# Patient Record
Sex: Female | Born: 1980 | Race: Black or African American | Hispanic: No | Smoking: Current every day smoker
Health system: Southern US, Community
[De-identification: ages and names within clinical notes are randomized; demographics above are authoritative.]

## PROBLEM LIST (undated history)

## (undated) ENCOUNTER — Inpatient Hospital Stay (HOSPITAL_COMMUNITY): Payer: Self-pay

## (undated) DIAGNOSIS — N10 Acute tubulo-interstitial nephritis: Secondary | ICD-10-CM

---

## 2003-04-29 ENCOUNTER — Encounter: Payer: Self-pay | Admitting: Obstetrics & Gynecology

## 2003-04-29 ENCOUNTER — Inpatient Hospital Stay (HOSPITAL_COMMUNITY): Admission: AD | Admit: 2003-04-29 | Discharge: 2003-04-29 | Payer: Self-pay | Admitting: Obstetrics & Gynecology

## 2003-05-23 ENCOUNTER — Inpatient Hospital Stay (HOSPITAL_COMMUNITY): Admission: AD | Admit: 2003-05-23 | Discharge: 2003-05-23 | Payer: Self-pay | Admitting: Family Medicine

## 2003-07-13 ENCOUNTER — Ambulatory Visit (HOSPITAL_COMMUNITY): Admission: RE | Admit: 2003-07-13 | Discharge: 2003-07-13 | Payer: Self-pay | Admitting: *Deleted

## 2003-09-29 ENCOUNTER — Inpatient Hospital Stay (HOSPITAL_COMMUNITY): Admission: AD | Admit: 2003-09-29 | Discharge: 2003-09-29 | Payer: Self-pay | Admitting: Obstetrics and Gynecology

## 2003-11-06 ENCOUNTER — Inpatient Hospital Stay (HOSPITAL_COMMUNITY): Admission: AD | Admit: 2003-11-06 | Discharge: 2003-11-06 | Payer: Self-pay | Admitting: Obstetrics and Gynecology

## 2003-12-13 ENCOUNTER — Inpatient Hospital Stay (HOSPITAL_COMMUNITY): Admission: AD | Admit: 2003-12-13 | Discharge: 2003-12-13 | Payer: Self-pay | Admitting: Family Medicine

## 2003-12-17 ENCOUNTER — Encounter: Admission: RE | Admit: 2003-12-17 | Discharge: 2003-12-17 | Payer: Self-pay | Admitting: *Deleted

## 2003-12-18 ENCOUNTER — Inpatient Hospital Stay (HOSPITAL_COMMUNITY): Admission: AD | Admit: 2003-12-18 | Discharge: 2003-12-21 | Payer: Self-pay | Admitting: Family Medicine

## 2004-08-11 ENCOUNTER — Inpatient Hospital Stay (HOSPITAL_COMMUNITY): Admission: AD | Admit: 2004-08-11 | Discharge: 2004-08-11 | Payer: Self-pay | Admitting: Obstetrics & Gynecology

## 2004-12-09 ENCOUNTER — Emergency Department (HOSPITAL_COMMUNITY): Admission: EM | Admit: 2004-12-09 | Discharge: 2004-12-09 | Payer: Self-pay | Admitting: Emergency Medicine

## 2005-03-01 ENCOUNTER — Inpatient Hospital Stay (HOSPITAL_COMMUNITY): Admission: AD | Admit: 2005-03-01 | Discharge: 2005-03-01 | Payer: Self-pay | Admitting: Obstetrics & Gynecology

## 2005-06-09 ENCOUNTER — Inpatient Hospital Stay (HOSPITAL_COMMUNITY): Admission: AD | Admit: 2005-06-09 | Discharge: 2005-06-09 | Payer: Self-pay | Admitting: *Deleted

## 2005-06-13 ENCOUNTER — Inpatient Hospital Stay (HOSPITAL_COMMUNITY): Admission: AD | Admit: 2005-06-13 | Discharge: 2005-06-13 | Payer: Self-pay | Admitting: Family Medicine

## 2005-07-20 ENCOUNTER — Emergency Department (HOSPITAL_COMMUNITY): Admission: EM | Admit: 2005-07-20 | Discharge: 2005-07-21 | Payer: Self-pay | Admitting: Emergency Medicine

## 2006-09-27 ENCOUNTER — Emergency Department (HOSPITAL_COMMUNITY): Admission: EM | Admit: 2006-09-27 | Discharge: 2006-09-27 | Payer: Self-pay | Admitting: Emergency Medicine

## 2007-06-02 ENCOUNTER — Inpatient Hospital Stay (HOSPITAL_COMMUNITY): Admission: AD | Admit: 2007-06-02 | Discharge: 2007-06-02 | Payer: Self-pay | Admitting: Family Medicine

## 2007-06-13 ENCOUNTER — Inpatient Hospital Stay (HOSPITAL_COMMUNITY): Admission: AD | Admit: 2007-06-13 | Discharge: 2007-06-13 | Payer: Self-pay | Admitting: Obstetrics and Gynecology

## 2007-08-28 ENCOUNTER — Inpatient Hospital Stay (HOSPITAL_COMMUNITY): Admission: RE | Admit: 2007-08-28 | Discharge: 2007-08-28 | Payer: Self-pay | Admitting: Obstetrics & Gynecology

## 2008-01-18 ENCOUNTER — Inpatient Hospital Stay (HOSPITAL_COMMUNITY): Admission: AD | Admit: 2008-01-18 | Discharge: 2008-01-21 | Payer: Self-pay | Admitting: Obstetrics

## 2008-01-19 ENCOUNTER — Encounter: Payer: Self-pay | Admitting: Obstetrics

## 2010-11-05 ENCOUNTER — Emergency Department (HOSPITAL_COMMUNITY)
Admission: EM | Admit: 2010-11-05 | Discharge: 2010-11-05 | Disposition: A | Discharge status: Home / Self Care | Payer: Self-pay | Attending: Emergency Medicine | Admitting: Emergency Medicine

## 2010-11-05 DIAGNOSIS — K143 Hypertrophy of tongue papillae: Secondary | ICD-10-CM | POA: Insufficient documentation

## 2011-01-11 ENCOUNTER — Emergency Department (HOSPITAL_COMMUNITY)
Admission: EM | Admit: 2011-01-11 | Discharge: 2011-01-12 | Disposition: A | Discharge status: Home / Self Care | Payer: Self-pay | Attending: Emergency Medicine | Admitting: Emergency Medicine

## 2011-01-11 DIAGNOSIS — R112 Nausea with vomiting, unspecified: Secondary | ICD-10-CM | POA: Insufficient documentation

## 2011-01-11 DIAGNOSIS — R300 Dysuria: Secondary | ICD-10-CM | POA: Insufficient documentation

## 2011-01-11 DIAGNOSIS — R109 Unspecified abdominal pain: Secondary | ICD-10-CM | POA: Insufficient documentation

## 2011-01-11 DIAGNOSIS — N12 Tubulo-interstitial nephritis, not specified as acute or chronic: Secondary | ICD-10-CM | POA: Insufficient documentation

## 2011-01-11 LAB — URINALYSIS, ROUTINE W REFLEX MICROSCOPIC
Bilirubin Urine: NEGATIVE
Glucose, UA: NEGATIVE mg/dL
Ketones, ur: NEGATIVE mg/dL
Nitrite: NEGATIVE
Protein, ur: 30 mg/dL — AB
Specific Gravity, Urine: 1.012 (ref 1.005–1.030)
Urobilinogen, UA: 1 mg/dL (ref 0.0–1.0)
pH: 6.5 (ref 5.0–8.0)

## 2011-01-11 LAB — POCT I-STAT, CHEM 8
BUN: 6 mg/dL (ref 6–23)
Calcium, Ion: 1.17 mmol/L (ref 1.12–1.32)
Chloride: 105 mEq/L (ref 96–112)
Creatinine, Ser: 0.9 mg/dL (ref 0.4–1.2)
Glucose, Bld: 92 mg/dL (ref 70–99)
HCT: 40 % (ref 36.0–46.0)
Hemoglobin: 13.6 g/dL (ref 12.0–15.0)
Potassium: 3.9 mEq/L (ref 3.5–5.1)
Sodium: 140 mEq/L (ref 135–145)
TCO2: 25 mmol/L (ref 0–100)

## 2011-01-11 LAB — URINE MICROSCOPIC-ADD ON

## 2011-01-11 LAB — POCT PREGNANCY, URINE: Preg Test, Ur: NEGATIVE

## 2011-01-13 LAB — URINE CULTURE
Colony Count: >=100000
Culture  Setup Time: 201205310036

## 2011-05-03 LAB — CBC
HCT: 33.9 — ABNORMAL LOW
Hemoglobin: 11.8 — ABNORMAL LOW
MCHC: 34.9
MCV: 93.8
Platelets: 280
RBC: 3.61 — ABNORMAL LOW
RDW: 13.8
WBC: 14.5 — ABNORMAL HIGH

## 2011-05-03 LAB — WET PREP, GENITAL
Clue Cells Wet Prep HPF POC: NONE SEEN
Trich, Wet Prep: NONE SEEN
Yeast Wet Prep HPF POC: NONE SEEN

## 2011-05-03 LAB — ABO/RH: ABO/RH(D): O POS

## 2011-05-03 LAB — GC/CHLAMYDIA PROBE AMP, GENITAL
Chlamydia, DNA Probe: NEGATIVE
GC Probe Amp, Genital: NEGATIVE

## 2011-05-03 LAB — SICKLE CELL SCREEN: Sickle Cell Screen: NEGATIVE

## 2011-05-10 LAB — CBC
HCT: 31 — ABNORMAL LOW
HCT: 37.5
Hemoglobin: 10.9 — ABNORMAL LOW
Hemoglobin: 13.1
MCHC: 35
MCHC: 35.3
MCV: 92.4
MCV: 92.7
Platelets: 186
Platelets: 238
RBC: 3.35 — ABNORMAL LOW
RBC: 4.04
RDW: 13.9
RDW: 14.1
WBC: 11.5 — ABNORMAL HIGH
WBC: 11.9 — ABNORMAL HIGH

## 2011-05-10 LAB — RPR: RPR Ser Ql: NONREACTIVE

## 2011-05-23 LAB — URINALYSIS, ROUTINE W REFLEX MICROSCOPIC
Bilirubin Urine: NEGATIVE
Glucose, UA: NEGATIVE
Hgb urine dipstick: NEGATIVE
Ketones, ur: NEGATIVE
Nitrite: NEGATIVE
Protein, ur: NEGATIVE
Specific Gravity, Urine: 1.02
Urobilinogen, UA: 1
pH: 6.5

## 2011-05-23 LAB — WET PREP, GENITAL
Trich, Wet Prep: NONE SEEN
Yeast Wet Prep HPF POC: NONE SEEN

## 2011-05-23 LAB — CBC
Hemoglobin: 13.1
RBC: 4.07
RDW: 12.7

## 2011-05-23 LAB — GC/CHLAMYDIA PROBE AMP, GENITAL: Chlamydia, DNA Probe: NEGATIVE

## 2011-05-23 LAB — HCG, QUANTITATIVE, PREGNANCY: hCG, Beta Chain, Quant, S: 52440 — ABNORMAL HIGH

## 2011-06-05 ENCOUNTER — Emergency Department (HOSPITAL_COMMUNITY)
Admission: EM | Admit: 2011-06-05 | Discharge: 2011-06-05 | Disposition: A | Discharge status: Home / Self Care | Payer: Self-pay | Attending: Emergency Medicine | Admitting: Emergency Medicine

## 2011-06-05 DIAGNOSIS — R109 Unspecified abdominal pain: Secondary | ICD-10-CM | POA: Insufficient documentation

## 2011-06-05 LAB — URINE MICROSCOPIC-ADD ON

## 2011-06-05 LAB — POCT PREGNANCY, URINE: Preg Test, Ur: NEGATIVE

## 2011-06-05 LAB — URINALYSIS, ROUTINE W REFLEX MICROSCOPIC
Bilirubin Urine: NEGATIVE
Hgb urine dipstick: NEGATIVE
Ketones, ur: NEGATIVE mg/dL
Nitrite: NEGATIVE
pH: 6 (ref 5.0–8.0)

## 2011-06-11 ENCOUNTER — Emergency Department (HOSPITAL_COMMUNITY): Payer: Self-pay

## 2011-06-11 ENCOUNTER — Emergency Department (HOSPITAL_COMMUNITY)
Admission: EM | Admit: 2011-06-11 | Discharge: 2011-06-11 | Disposition: A | Discharge status: Home / Self Care | Payer: Self-pay | Attending: Emergency Medicine | Admitting: Emergency Medicine

## 2011-06-11 DIAGNOSIS — R1032 Left lower quadrant pain: Secondary | ICD-10-CM | POA: Insufficient documentation

## 2011-06-11 DIAGNOSIS — N760 Acute vaginitis: Secondary | ICD-10-CM | POA: Insufficient documentation

## 2011-06-11 DIAGNOSIS — N390 Urinary tract infection, site not specified: Secondary | ICD-10-CM | POA: Insufficient documentation

## 2011-06-11 DIAGNOSIS — B9689 Other specified bacterial agents as the cause of diseases classified elsewhere: Secondary | ICD-10-CM | POA: Insufficient documentation

## 2011-06-11 DIAGNOSIS — A499 Bacterial infection, unspecified: Secondary | ICD-10-CM | POA: Insufficient documentation

## 2011-06-11 LAB — POCT PREGNANCY, URINE: Preg Test, Ur: NEGATIVE

## 2011-06-11 LAB — URINE MICROSCOPIC-ADD ON

## 2011-06-11 LAB — URINALYSIS, ROUTINE W REFLEX MICROSCOPIC
Bilirubin Urine: NEGATIVE
Glucose, UA: NEGATIVE mg/dL
Hgb urine dipstick: NEGATIVE
Ketones, ur: NEGATIVE mg/dL
Specific Gravity, Urine: 1.023 (ref 1.005–1.030)
pH: 7.5 (ref 5.0–8.0)

## 2011-06-11 LAB — WET PREP, GENITAL: Yeast Wet Prep HPF POC: NONE SEEN

## 2011-06-12 LAB — GC/CHLAMYDIA PROBE AMP, GENITAL
Chlamydia, DNA Probe: NEGATIVE
GC Probe Amp, Genital: NEGATIVE

## 2011-06-14 LAB — URINE CULTURE

## 2011-12-28 ENCOUNTER — Emergency Department (HOSPITAL_COMMUNITY)
Admission: EM | Admit: 2011-12-28 | Discharge: 2011-12-28 | Disposition: A | Discharge status: Home Health | Payer: Self-pay | Attending: Emergency Medicine | Admitting: Emergency Medicine

## 2011-12-28 ENCOUNTER — Encounter (HOSPITAL_COMMUNITY): Payer: Self-pay | Admitting: Emergency Medicine

## 2011-12-28 DIAGNOSIS — Z79899 Other long term (current) drug therapy: Secondary | ICD-10-CM | POA: Insufficient documentation

## 2011-12-28 DIAGNOSIS — S91109A Unspecified open wound of unspecified toe(s) without damage to nail, initial encounter: Secondary | ICD-10-CM | POA: Insufficient documentation

## 2011-12-28 DIAGNOSIS — IMO0002 Reserved for concepts with insufficient information to code with codable children: Secondary | ICD-10-CM | POA: Insufficient documentation

## 2011-12-28 DIAGNOSIS — S91209A Unspecified open wound of unspecified toe(s) with damage to nail, initial encounter: Secondary | ICD-10-CM

## 2011-12-28 DIAGNOSIS — F17200 Nicotine dependence, unspecified, uncomplicated: Secondary | ICD-10-CM | POA: Insufficient documentation

## 2011-12-28 NOTE — ED Provider Notes (Signed)
History     CSN: [id number]  Arrival date & time 12/28/11  1517   First MD Initiated Contact with Patient 12/28/11 1550      Chief Complaint  Patient presents with  . Nail Problem    (Consider location/radiation/quality/duration/timing/severity/associated sxs/prior treatment) HPI  Patient presents to the ED with complaints of bilateral great toenails falling off. She states that she keeps her toenails really long and her boyfriend accidentally hit the tips of them causing them to lifts off the nailbed. She also thinks that they may be infected. She denies pain or any other symptoms. She admits to going to a Salon and having her toes done but is unsure of if they clean their utensils like they are supposed to.  History reviewed. No pertinent past medical history.  History reviewed. No pertinent past surgical history.  History reviewed. No pertinent family history.  History  Substance Use Topics  . Smoking status: Current Everyday Smoker -- 0.5 packs/day  . Smokeless tobacco: Not on file  . Alcohol Use: Yes     occasion    OB History    Grav Para Term Preterm Abortions TAB SAB Ect Mult Living                  Review of Systems   HEENT: denies blurry vision or change in hearing PULMONARY: Denies difficulty breathing and SOB CARDIAC: denies chest pain or heart palpitations MUSCULOSKELETAL:  denies being unable to ambulate ABDOMEN AL: denies abdominal pain GU: denies loss of bowel or urinary control NEURO: denies numbness and tingling in extremities   Allergies  Shellfish allergy  Home Medications   Current Outpatient Rx  Name Route Sig Dispense Refill  . CYPROHEPTADINE HCL 4 MG PO TABS Oral Take 4 mg by mouth 3 (three) times daily as needed. For appetite      BP 104/54  Pulse 72  Temp(Src) 98.5 F (36.9 C) (Oral)  Resp 16  SpO2 99%  LMP 12/10/2011  Physical Exam  Nursing note and vitals reviewed. Constitutional: She appears well-developed and  well-nourished. No distress.  HENT:  Head: Normocephalic and atraumatic.  Eyes: Pupils are equal, round, and reactive to light.  Neck: Normal range of motion. Neck supple.  Cardiovascular: Normal rate and regular rhythm.   Pulmonary/Chest: Effort normal.  Abdominal: Soft.  Musculoskeletal:       Feet:       Bilateral great toenails raised off of tip of nail bed. No bleeding. Broken off toenails appear to have fungal infection.  Neurological: She is alert.  Skin: Skin is warm and dry.    ED Course  Procedures (including critical care time)  Labs Reviewed - No data to display No results found.   1. Toenail avulsion       MDM  .Partial toenail removal of bilateral great toenails. Infected portion of toenails cut off. Patient tolerated procedure well. Has been advised to do toes herself or go to a new salon. Pt voices understanding. Will refer to podiatry for further management if symptoms return.  Pt has been advised of the symptoms that warrant their return to the ED. Patient has voiced understanding and has agreed to follow-up with the PCP or specialist.         Dorthula Matas, PA 12/28/11 1646

## 2011-12-28 NOTE — ED Notes (Signed)
Pt's bil. Great toe nails having been starting to come off- first noticed it last month; pt able pull toenail back on both nails

## 2011-12-28 NOTE — ED Notes (Signed)
Patient with right great toe injury, patient states toe hit and nail broken last night, patient also states nails on great toes bilaterally becoming thin and patient is afraid of nail injury

## 2011-12-28 NOTE — Discharge Instructions (Signed)
Fingernail or Toenail Loss All or part of your fingernail or toenail has been lost. This may or may not grow back as a normal nail. A special non-stick bandage has been put on your finger or toe tightly to prevent bleeding. HOME CARE INSTRUCTIONS  The tips of fingers and toes are full of nerves and injuries are often very painful. The following will help you decrease the pain and obtain the best outcome.  Keep your hand or foot elevated above your heart to relieve pain and swelling. This will require lying in bed or on a couch with the hand or leg on pillows or sitting in a recliner with the leg up. Letting your hand or leg dangle may increase swelling, slow healing and cause throbbing pain.   Keep your dressing dry and clean.   Change your bandage in 24 hours after going home.   After your bandage is changed, soak your hand or foot in warm soapy water for 10 to 20 minutes. Do this 3 times per day. This helps reduce pain and swelling. After soaking, apply a clean, dry bandage. Change your bandage if it is wet or dirty.   Only take over-the-counter or prescription medicines for pain, discomfort, or fever as directed by your caregiver.   See your caregiver as needed for problems.  SEEK IMMEDIATE MEDICAL CARE IF:   You have increased pain, swelling, drainage, or bleeding.   You have a fever.  MAKE SURE YOU:   Understand these instructions.   Will watch your condition.   Will get help right away if you are not doing well or get worse.  Document Released: 06/21/2006 Document Revised: 07/19/2011 Document Reviewed: 09/10/2006 [employer] Patient Information 2012 [employer]. 

## 2011-12-30 NOTE — ED Provider Notes (Signed)
Medical screening examination/treatment/procedure(s) were performed by non-physician practitioner and as supervising physician I was immediately available for consultation/collaboration.  Flint Melter, MD 12/30/11 [phone]

## 2012-02-07 ENCOUNTER — Encounter (HOSPITAL_COMMUNITY): Payer: Self-pay

## 2012-02-07 ENCOUNTER — Emergency Department (HOSPITAL_COMMUNITY)
Admission: EM | Admit: 2012-02-07 | Discharge: 2012-02-07 | Disposition: A | Discharge status: Home / Self Care | Payer: Self-pay | Attending: Emergency Medicine | Admitting: Emergency Medicine

## 2012-02-07 DIAGNOSIS — K089 Disorder of teeth and supporting structures, unspecified: Secondary | ICD-10-CM | POA: Insufficient documentation

## 2012-02-07 DIAGNOSIS — F17200 Nicotine dependence, unspecified, uncomplicated: Secondary | ICD-10-CM | POA: Insufficient documentation

## 2012-02-07 DIAGNOSIS — K0889 Other specified disorders of teeth and supporting structures: Secondary | ICD-10-CM

## 2012-02-07 MED ORDER — HYDROCODONE-ACETAMINOPHEN 5-325 MG PO TABS
1.0000 | ORAL_TABLET | Freq: Once | ORAL | Status: AC
  Administered 2012-02-07: 1 via ORAL
  Filled 2012-02-07: qty 1

## 2012-02-07 MED ORDER — PENICILLIN V POTASSIUM 500 MG PO TABS: 500.0000 mg | ORAL_TABLET | Freq: Four times a day (QID) | ORAL | Status: AC

## 2012-02-07 MED ORDER — HYDROCODONE-ACETAMINOPHEN 5-325 MG PO TABS: 1.0000 | ORAL_TABLET | Freq: Four times a day (QID) | ORAL | Status: AC | PRN

## 2012-02-07 NOTE — ED Provider Notes (Signed)
History   This chart was scribed for Benny Lennert, MD by Sofie Rower. The patient was seen in room TR05C/TR05C and the patient's care was started at 3:43 PM     CSN: [id number]  Arrival date & time 02/07/12  1449   None     Chief Complaint  Patient presents with  . Dental Pain    (Consider location/radiation/quality/duration/timing/severity/associated sxs/prior treatment) Patient is a 31 y.o. female presenting with tooth pain. The history is provided by the patient. No language interpreter was used.  Dental PainThe primary symptoms include mouth pain, headaches and sore throat. Primary symptoms do not include cough. The symptoms began yesterday. The symptoms are unchanged. The symptoms are new. The symptoms occur constantly.  Mouth pain began 24 -48 hours ago. Mouth pain occurs constantly. Mouth pain is unchanged. Affected locations include: teeth.  The headache began yesterday. The headache developed suddenly. Headache is a new problem. The headache is present rarely.  The sore throat began yesterday. The sore throat has been unchanged since its onset. The sore throat is moderate in intensity. The sore throat is accompanied by trouble swallowing.  Additional symptoms include: trouble swallowing. Additional symptoms do not include: fatigue.     History  Substance Use Topics  . Smoking status: Current Everyday Smoker -- 0.5 packs/day  . Smokeless tobacco: Not on file  . Alcohol Use: Yes     occasion    OB History    Grav Para Term Preterm Abortions TAB SAB Ect Mult Living                  Review of Systems  Constitutional: Negative for fatigue.  HENT: Positive for sore throat and trouble swallowing. Negative for congestion, sinus pressure and ear discharge.   Eyes: Negative for discharge.  Respiratory: Negative for cough.   Cardiovascular: Negative for chest pain.  Gastrointestinal: Negative for abdominal pain and diarrhea.  Genitourinary: Negative for frequency and  hematuria.  Musculoskeletal: Negative for back pain.  Skin: Negative for rash.  Neurological: Positive for headaches. Negative for seizures.  Hematological: Negative.   Psychiatric/Behavioral: Negative for hallucinations.    Allergies  Shellfish allergy  Home Medications   Current Outpatient Rx  Name Route Sig Dispense Refill  . CYPROHEPTADINE HCL 4 MG PO TABS Oral Take 4 mg by mouth 3 (three) times daily as needed. For appetite    . VICODIN PO Oral Take 1 tablet by mouth once.    Marland Kitchen ADVIL PM PO Oral Take 1 tablet by mouth daily as needed.    Marland Kitchen PERCOCET PO Oral Take 1 tablet by mouth once.      BP 118/92  Pulse 73  Temp 97.5 F (36.4 C) (Oral)  Resp 18  SpO2 100%  Physical Exam  Nursing note and vitals reviewed. Constitutional: She is oriented to person, place, and time. She appears well-developed.  HENT:  Head: Normocephalic.       Molar on the right posterior is eroded and tender. Left upper posterior tooth broken.   Eyes: Conjunctivae are normal.  Neck: No tracheal deviation present.  Cardiovascular:  No murmur heard. Musculoskeletal: Normal range of motion.  Neurological: She is oriented to person, place, and time.  Skin: Skin is warm.  Psychiatric: She has a normal mood and affect.    ED Course  Procedures (including critical care time)  DIAGNOSTIC STUDIES: Oxygen Saturation is 100% on room air, normal by my interpretation.    COORDINATION OF CARE:   3:46PM- EDP  at bedside discusses treatment plan concerning follow up with a dentist.   Labs Reviewed - No data to display No results found.   No diagnosis found.    MDM        The chart was scribed for me under my direct supervision.  I personally performed the history, physical, and medical decision making and all procedures in the evaluation of this patient.Benny Lennert, MD 02/07/12 [phone]

## 2012-02-07 NOTE — ED Notes (Signed)
sts cant eat but with her front teeth, it is a health hazard, has sore throat, dental pain in her entire mouht and head pain

## 2012-02-07 NOTE — Discharge Instructions (Signed)
Follow up with dr. Knox next week. 

## 2012-11-14 ENCOUNTER — Encounter (HOSPITAL_COMMUNITY): Payer: Self-pay

## 2012-11-14 ENCOUNTER — Emergency Department (HOSPITAL_COMMUNITY)
Admission: EM | Admit: 2012-11-14 | Discharge: 2012-11-14 | Disposition: A | Discharge status: Home / Self Care | Payer: Self-pay | Attending: Emergency Medicine | Admitting: Emergency Medicine

## 2012-11-14 DIAGNOSIS — N72 Inflammatory disease of cervix uteri: Secondary | ICD-10-CM | POA: Insufficient documentation

## 2012-11-14 DIAGNOSIS — R1032 Left lower quadrant pain: Secondary | ICD-10-CM

## 2012-11-14 DIAGNOSIS — R109 Unspecified abdominal pain: Secondary | ICD-10-CM | POA: Insufficient documentation

## 2012-11-14 DIAGNOSIS — N898 Other specified noninflammatory disorders of vagina: Secondary | ICD-10-CM | POA: Insufficient documentation

## 2012-11-14 DIAGNOSIS — Z87448 Personal history of other diseases of urinary system: Secondary | ICD-10-CM | POA: Insufficient documentation

## 2012-11-14 DIAGNOSIS — F17200 Nicotine dependence, unspecified, uncomplicated: Secondary | ICD-10-CM | POA: Insufficient documentation

## 2012-11-14 DIAGNOSIS — N63 Unspecified lump in unspecified breast: Secondary | ICD-10-CM | POA: Insufficient documentation

## 2012-11-14 DIAGNOSIS — Z3202 Encounter for pregnancy test, result negative: Secondary | ICD-10-CM | POA: Insufficient documentation

## 2012-11-14 DIAGNOSIS — Z87440 Personal history of urinary (tract) infections: Secondary | ICD-10-CM | POA: Insufficient documentation

## 2012-11-14 LAB — URINALYSIS, ROUTINE W REFLEX MICROSCOPIC
Bilirubin Urine: NEGATIVE
Ketones, ur: NEGATIVE mg/dL
Leukocytes, UA: NEGATIVE
Nitrite: NEGATIVE
Specific Gravity, Urine: 1.024 (ref 1.005–1.030)
Urobilinogen, UA: 1 mg/dL (ref 0.0–1.0)
pH: 6.5 (ref 5.0–8.0)

## 2012-11-14 LAB — WET PREP, GENITAL
Trich, Wet Prep: NONE SEEN
Yeast Wet Prep HPF POC: NONE SEEN

## 2012-11-14 MED ORDER — CEFTRIAXONE SODIUM 250 MG IJ SOLR
250.0000 mg | Freq: Once | INTRAMUSCULAR | Status: AC
  Administered 2012-11-14: 250 mg via INTRAMUSCULAR
  Filled 2012-11-14: qty 250

## 2012-11-14 MED ORDER — METRONIDAZOLE 500 MG PO TABS
2000.0000 mg | ORAL_TABLET | Freq: Once | ORAL | Status: AC
  Administered 2012-11-14: 2000 mg via ORAL
  Filled 2012-11-14: qty 4

## 2012-11-14 MED ORDER — AZITHROMYCIN 1 G PO PACK
1.0000 g | PACK | Freq: Once | ORAL | Status: AC
  Administered 2012-11-14: 1 g via ORAL
  Filled 2012-11-14: qty 1

## 2012-11-14 NOTE — ED Provider Notes (Signed)
History     CSN: [id number]  Arrival date & time 11/14/12  1717   First MD Initiated Contact with Patient 11/14/12 1821      Chief Complaint  Patient presents with  . Abdominal Pain  . Flank Pain  . Vaginal Discharge    (Consider location/radiation/quality/duration/timing/severity/associated sxs/prior treatment) Patient is a 32 y.o. female presenting with abdominal pain. The history is provided by the patient. No language interpreter was used.  Abdominal Pain Pain location:  LLQ and L flank Pain quality: sharp   Pain radiates to:  L flank Pain severity:  Moderate Onset quality:  Gradual Duration:  1 day Timing:  Intermittent Progression:  Waxing and waning Chronicity:  New Context: not previous surgeries, not recent illness, not sick contacts, not suspicious food intake and not trauma   Relieved by:  Nothing Worsened by:  Nothing tried Ineffective treatments:  None tried Associated symptoms: vaginal discharge   Associated symptoms: no anorexia, no chest pain, no chills, no constipation, no cough, no diarrhea, no dysuria, no fatigue, no fever, no hematuria, no nausea, no shortness of breath, no sore throat, no vaginal bleeding and no vomiting   Vaginal discharge:    Quality:  Milky and white   Duration:  1 day   Timing:  Constant   Progression:  Unchanged   Chronicity:  New   Past Medical History  Diagnosis Date  . Kidney infection     History reviewed. No pertinent past surgical history.  No family history on file.  History  Substance Use Topics  . Smoking status: Current Every Day Smoker -- 0.50 packs/day  . Smokeless tobacco: Not on file  . Alcohol Use: Yes     Comment: occasion    OB History   Grav Para Term Preterm Abortions TAB SAB Ect Mult Living                  Review of Systems  Constitutional: Negative for fever, chills, diaphoresis, activity change, appetite change and fatigue.  HENT: Negative for congestion, sore throat, facial swelling,  rhinorrhea, neck pain and neck stiffness.   Eyes: Negative for photophobia and discharge.  Respiratory: Negative for cough, chest tightness and shortness of breath.   Cardiovascular: Negative for chest pain, palpitations and leg swelling.  Gastrointestinal: Positive for abdominal pain. Negative for nausea, vomiting, diarrhea, constipation and anorexia.  Endocrine: Negative for polydipsia and polyuria.  Genitourinary: Positive for vaginal discharge. Negative for dysuria, frequency, hematuria, vaginal bleeding, difficulty urinating and pelvic pain.  Musculoskeletal: Negative for back pain and arthralgias.  Skin: Negative for color change and wound.  Allergic/Immunologic: Negative for immunocompromised state.  Neurological: Negative for facial asymmetry, weakness, numbness and headaches.  Hematological: Does not bruise/bleed easily.  Psychiatric/Behavioral: Negative for confusion and agitation.    Allergies  Shellfish allergy  Home Medications  No current outpatient prescriptions on file.  BP 118/88  Pulse 72  Temp(Src) 98.1 F (36.7 C) (Oral)  Resp 20  SpO2 100%  LMP 10/26/2012  Physical Exam  Constitutional: She is oriented to person, place, and time. She appears well-developed and well-nourished. No distress.  HENT:  Head: Normocephalic and atraumatic.  Mouth/Throat: No oropharyngeal exudate.  Eyes: Pupils are equal, round, and reactive to light.  Neck: Normal range of motion. Neck supple.  Cardiovascular: Normal rate, regular rhythm and normal heart sounds.  Exam reveals no gallop and no friction rub.   No murmur heard. Pulmonary/Chest: Effort normal and breath sounds normal. No respiratory distress. She has  no wheezes. She has no rales.  Abdominal: Soft. Bowel sounds are normal. She exhibits no distension and no mass. There is tenderness in the left lower quadrant. There is no rebound, no guarding and no CVA tenderness.  Genitourinary: Cervix exhibits discharge. Cervix  exhibits no motion tenderness (mild tenderness at cervix) and no friability.  Musculoskeletal: Normal range of motion. She exhibits no edema and no tenderness.  Neurological: She is alert and oriented to person, place, and time.  Skin: Skin is warm and dry.  Psychiatric: She has a normal mood and affect.    ED Course  Procedures (including critical care time)  Labs Reviewed  WET PREP, GENITAL - Abnormal; Notable for the following:    Clue Cells Wet Prep HPF POC FEW (*)    WBC, Wet Prep HPF POC FEW (*)    All other components within normal limits  GC/CHLAMYDIA PROBE AMP  URINALYSIS, ROUTINE W REFLEX MICROSCOPIC  POCT PREGNANCY, URINE   No results found.   1. Left lower quadrant pain   2. Cervicitis   3. Breast lump in upper outer quadrant       MDM  Pt is a 32 y.o. female with pertinent PMHX of UTI who presents with 24hrs of LLQ ab/pelvic pain, radiation to left flank and inc vaginal d/c.  No fever, n/v, d/a, urinary symptoms.  VSS, pt in NAD, afebrile on exam. +TTP suprapubic and LLQ w/o rebound or guarding.  Pelvis done w/ mild cervical tenderness on palpation, moderate d/c, but no adnexal tenderness.  Pt does not want to stay for results of wet prep as she needs ot pick up her kids.  I doubt acute surgical cause of pain and feel she is safe to go.  Will treat presumptively for cervicitis with rocephin, azithro, flagyl.  Pt has secondary complaint of R sided breast lump that becomes larger & tender w/ menstrual cycle.  approx 1cm tender nodule palpated in R outer, upper breast.  Will have her f/u with primary care for further w/u, likely breast US. Return precautions given for new or worsening symptoms.     1. Left lower quadrant pain   2. Cervicitis   3. Breast lump in upper outer quadrant      Labs and imaging considered in decision making, reviewed by myself.  Imaging interpreted by radiology. Pt care discussed with my attending, Dr. Radford Pax.         Toy Cookey, MD 11/15/12 [phone]

## 2012-11-14 NOTE — ED Notes (Signed)
Pt comfortable with d/c and f/u instructions. Pt comfortable with at home pain management.

## 2012-11-14 NOTE — ED Notes (Addendum)
Pt c/o LLQ pain since yesterday radiating to left flank area, also reports cloudy and dark urine for about a week now. She reports a hx of kidney infection before. Pt also reports a slight odor and vaginal discharge.

## 2012-11-15 NOTE — ED Provider Notes (Signed)
I saw the patient, reviewed the resident's note and I agree with the findings and plan.   .Face to face Exam:  General:  Awake HEENT:  Atraumatic Resp:  Normal effort Abd:  Nondistended Neuro:No focal weakness    Nelia Shi, MD 11/15/12 [phone]

## 2012-12-08 ENCOUNTER — Emergency Department (HOSPITAL_COMMUNITY)
Admission: EM | Admit: 2012-12-08 | Discharge: 2012-12-08 | Disposition: A | Discharge status: Home / Self Care | Payer: Self-pay | Attending: Emergency Medicine | Admitting: Emergency Medicine

## 2012-12-08 ENCOUNTER — Encounter (HOSPITAL_COMMUNITY): Payer: Self-pay | Admitting: *Deleted

## 2012-12-08 DIAGNOSIS — K089 Disorder of teeth and supporting structures, unspecified: Secondary | ICD-10-CM | POA: Insufficient documentation

## 2012-12-08 DIAGNOSIS — R6884 Jaw pain: Secondary | ICD-10-CM | POA: Insufficient documentation

## 2012-12-08 DIAGNOSIS — Z87448 Personal history of other diseases of urinary system: Secondary | ICD-10-CM | POA: Insufficient documentation

## 2012-12-08 DIAGNOSIS — K0889 Other specified disorders of teeth and supporting structures: Secondary | ICD-10-CM

## 2012-12-08 DIAGNOSIS — F17200 Nicotine dependence, unspecified, uncomplicated: Secondary | ICD-10-CM | POA: Insufficient documentation

## 2012-12-08 MED ORDER — HYDROCODONE-ACETAMINOPHEN 5-325 MG PO TABS
1.0000 | ORAL_TABLET | Freq: Four times a day (QID) | ORAL | Status: DC | PRN
Start: 2012-12-08 — End: 2013-01-09

## 2012-12-08 MED ORDER — PENICILLIN V POTASSIUM 500 MG PO TABS: 500.0000 mg | ORAL_TABLET | Freq: Four times a day (QID) | ORAL | Status: AC

## 2012-12-08 NOTE — ED Notes (Signed)
Pt is here with dental caries to right lower and left upper tooth pain

## 2012-12-08 NOTE — ED Notes (Addendum)
Pt has broken molars, top left and bottom right x 1 year. Has been hurting "for a long time" but worse this am.  Also, pt wants  to know the results of her GC/Chlarmydia tests done last week. States her phone was off for a few days.

## 2012-12-08 NOTE — ED Provider Notes (Signed)
History     CSN: [id number]  Arrival date & time 12/08/12  0750   First MD Initiated Contact with Patient 12/08/12 [phone]      Chief Complaint  Patient presents with  . Dental Pain    (Consider location/radiation/quality/duration/timing/severity/associated sxs/prior treatment) HPI Comments: Kathy Willis is a 32 y/o F presenting to the ED with dental pain to the left upper jaw. Patient reported that dental pain started x 1 week ago - stated that she broke her tooth a couple of weeks ago and the pain just started. Described discomfort as a constant shooting pain that radiates to the left maxillary region and left ear. Patient reported that she has right lower jaw discomfort, but that has been ongoing for the past year. Reported that she has not been taking anything to alleviate the discomfort. Stated that she has not follow-up with a dentist within 10 years. Denied fever, chills, swelling, facial asymmetry, gum bleeding, discharge, drainage, cyst and abscess formation, ear pain, gi symptoms, urinary symptoms, headache, dizziness.   The history is provided by the patient. No language interpreter was used.    Past Medical History  Diagnosis Date  . Kidney infection     History reviewed. No pertinent past surgical history.  No family history on file.  History  Substance Use Topics  . Smoking status: Current Every Day Smoker -- 0.50 packs/day  . Smokeless tobacco: Not on file  . Alcohol Use: Yes     Comment: occasion    OB History   Grav Para Term Preterm Abortions TAB SAB Ect Mult Living                  Review of Systems  Constitutional: Negative for fever and chills.  HENT: Positive for dental problem. Negative for ear pain, congestion, sore throat, facial swelling, trouble swallowing, neck pain and neck stiffness.   Eyes: Negative for pain and visual disturbance.  Respiratory: Negative for cough, chest tightness and shortness of breath.   Cardiovascular: Negative for  chest pain.  Gastrointestinal: Negative for nausea, vomiting, abdominal pain, diarrhea and constipation.  Genitourinary: Negative for dysuria, hematuria, decreased urine volume and difficulty urinating.  Musculoskeletal: Negative for myalgias, back pain and arthralgias.  Skin: Negative for rash.  Neurological: Negative for dizziness, weakness, light-headedness, numbness and headaches.  All other systems reviewed and are negative.    Allergies  Shellfish allergy  Home Medications   Current Outpatient Rx  Name  Route  Sig  Dispense  Refill  . cyproheptadine (PERIACTIN) 4 MG tablet   Oral   Take 4 mg by mouth daily.         Marland Kitchen HYDROcodone-acetaminophen (NORCO) 5-325 MG per tablet   Oral   Take 1 tablet by mouth every 6 (six) hours as needed for pain.   6 tablet   0   . penicillin v potassium (VEETID) 500 MG tablet   Oral   Take 1 tablet (500 mg total) by mouth 4 (four) times daily.   40 tablet   0     BP 123/79  Pulse 66  Temp(Src) 98.3 F (36.8 C) (Oral)  Resp 18  SpO2 100%  LMP 11/26/2012  Physical Exam  Nursing note and vitals reviewed. Constitutional: She is oriented to person, place, and time. She appears well-developed and well-nourished. No distress.  HENT:  Head: Normocephalic and atraumatic.  Mouth/Throat: Oropharynx is clear and moist. No oropharyngeal exudate.    No facial swelling noted. No erythema, inflammation, deformity,  sign of infection to face. Tenderness upon palpation to right maxillary region secondary to tooth pain  Mouth: uvula midline, symmetrical elevation. Negative erythema, inflammation, lesions, masses, abscesses, cysts, drainage, bleeding noted to mucusa and gums. Pain upon palpation to left upper second molar with tongue depressor. No peritonsillar abscess noted. No sublingual lesion noted. Negative trismus  Eyes: Conjunctivae and EOM are normal. Pupils are equal, round, and reactive to light. Right eye exhibits no discharge. Left eye  exhibits no discharge.  Neck: Normal range of motion. Neck supple. No tracheal deviation present.  Negative lymphadenopathy  Cardiovascular: Normal rate, regular rhythm and normal heart sounds.  Exam reveals no friction rub.   No murmur heard. Radial pulses 2+ bilaterally  Pulmonary/Chest: Effort normal and breath sounds normal. No respiratory distress. She has no wheezes. She has no rales.  Lymphadenopathy:    She has no cervical adenopathy.  Neurological: She is alert and oriented to person, place, and time. No cranial nerve deficit. She exhibits normal muscle tone. Coordination normal.  Skin: She is not diaphoretic.    ED Course  Procedures (including critical care time)  Labs Reviewed - No data to display No results found.   1. Pain, dental       MDM  I personally examined and evaluated the patient. Patient appears calm and cooperative. Broken tooth to right lower second molar and left upper second molar - pain upon palpation with tongue depressor to left upper second molar. No sublingual lesion noted - r/o Ludwig's Angina. Negative abscess, cyst formation. No sign of inflammation, erythema, drainage, bleeding. Patient afebrile, normotensive, non-tachycardic, alert and oriented. Patient aseptic, non-toxic appearing, no acute distress. Discharged patient. Discharged patient with antibiotics for infection coverage and pain medications - discussed course and restrictions while taking pain medications. Discussed with patient to stay hydrated. Recommended patient to follow-up with dentist - gave resource list to patient. Discussed with patient to monitor symptoms and if symptoms are to worse or change to please report back to the ED.  Patient agreed to plan of care, understood, all questions answered.         Raymon Mutton, PA-C 12/08/12 1902

## 2012-12-09 NOTE — ED Provider Notes (Signed)
Medical screening examination/treatment/procedure(s) were performed by non-physician practitioner and as supervising physician I was immediately available for consultation/collaboration.   Tamari Redwine, MD 12/09/12 1745 

## 2013-01-09 ENCOUNTER — Encounter (HOSPITAL_COMMUNITY): Payer: Self-pay | Admitting: *Deleted

## 2013-01-09 ENCOUNTER — Emergency Department (HOSPITAL_COMMUNITY)
Admission: EM | Admit: 2013-01-09 | Discharge: 2013-01-09 | Disposition: A | Discharge status: Home / Self Care | Payer: Self-pay | Attending: Emergency Medicine | Admitting: Emergency Medicine

## 2013-01-09 DIAGNOSIS — E049 Nontoxic goiter, unspecified: Secondary | ICD-10-CM | POA: Insufficient documentation

## 2013-01-09 DIAGNOSIS — K0889 Other specified disorders of teeth and supporting structures: Secondary | ICD-10-CM

## 2013-01-09 DIAGNOSIS — K089 Disorder of teeth and supporting structures, unspecified: Secondary | ICD-10-CM | POA: Insufficient documentation

## 2013-01-09 DIAGNOSIS — F17200 Nicotine dependence, unspecified, uncomplicated: Secondary | ICD-10-CM | POA: Insufficient documentation

## 2013-01-09 DIAGNOSIS — Z87448 Personal history of other diseases of urinary system: Secondary | ICD-10-CM | POA: Insufficient documentation

## 2013-01-09 MED ORDER — AMOXICILLIN 500 MG PO CAPS: 500.0000 mg | ORAL_CAPSULE | Freq: Three times a day (TID) | ORAL | Status: DC

## 2013-01-09 MED ORDER — HYDROCODONE-ACETAMINOPHEN 5-325 MG PO TABS: 2.0000 | ORAL_TABLET | ORAL | Status: DC | PRN

## 2013-01-09 MED ORDER — HYDROCODONE-ACETAMINOPHEN 5-325 MG PO TABS
2.0000 | ORAL_TABLET | Freq: Once | ORAL | Status: AC
  Administered 2013-01-09: 2 via ORAL
  Filled 2013-01-09: qty 2

## 2013-01-09 NOTE — ED Provider Notes (Signed)
History     CSN: [id number]  Arrival date & time 01/09/13  1241   First MD Initiated Contact with Patient 01/09/13 1259      Chief Complaint  Patient presents with  . Dental Pain    (Consider location/radiation/quality/duration/timing/severity/associated sxs/prior treatment) Patient is a 32 y.o. female presenting with tooth pain. The history is provided by the patient. No language interpreter was used.  Dental Pain Location:  Lower Lower teeth location:  31/RL 2nd molar Quality:  Aching and throbbing Severity:  Severe Timing:  Constant Progression:  Worsening Chronicity:  New Relieved by:  Nothing Worsened by:  Jaw movement and hot food/drink Ineffective treatments:  None tried Associated symptoms: no congestion     Past Medical History  Diagnosis Date  . Kidney infection     History reviewed. No pertinent past surgical history.  History reviewed. No pertinent family history.  History  Substance Use Topics  . Smoking status: Current Every Day Smoker -- 0.50 packs/day  . Smokeless tobacco: Not on file  . Alcohol Use: Yes     Comment: occasion    OB History   Grav Para Term Preterm Abortions TAB SAB Ect Mult Living                  Review of Systems  HENT: Positive for dental problem. Negative for congestion.   All other systems reviewed and are negative.    Allergies  Shellfish allergy  Home Medications  No current outpatient prescriptions on file.  BP 115/72  Pulse 100  Temp(Src) 99.6 F (37.6 C) (Oral)  Resp 18  SpO2 97%  LMP 12/16/2012  Physical Exam  Constitutional: She appears well-developed and well-nourished.  HENT:  Head: Normocephalic and atraumatic.  Right Ear: External ear normal.  Left Ear: External ear normal.  Swollen gumline,  Large cavity right lower 2nd molar  Neck: Thyromegaly present.  Cardiovascular: Normal rate.   Pulmonary/Chest: Effort normal.  Musculoskeletal: Normal range of motion.  Neurological: She is  alert.  Skin: Skin is warm.    ED Course  Procedures (including critical care time)  Labs Reviewed - No data to display No results found.   No diagnosis found.    MDM  Pt counseled on need to see dentist for treatment.   Pt has been referred previously but has not followed up        Elson Areas, PA-C 01/09/13 1316

## 2013-01-09 NOTE — ED Provider Notes (Signed)
Medical screening examination/treatment/procedure(s) were performed by non-physician practitioner and as supervising physician I was immediately available for consultation/collaboration.  Badr Piedra M Aveena Bari, MD 01/09/13 1634 

## 2013-01-09 NOTE — ED Notes (Signed)
Pt c/o right lower dental pain that started last night and has progressively gotten worse. Pt reports that she has noticed it was starting to rot but never went to the dentist. Pt reports she has had same issue before with another tooth. Pt reports she isn't able to eat or drink anything because it hurts too bad. Pt in nad, skin warm and dry, resp e/u. Speaking in full sentences.

## 2013-01-09 NOTE — ED Notes (Signed)
Pt reports severe dental pain right lower side. Airway intact.

## 2013-03-14 ENCOUNTER — Emergency Department (HOSPITAL_COMMUNITY)
Admission: EM | Admit: 2013-03-14 | Discharge: 2013-03-15 | Disposition: A | Discharge status: Home / Self Care | Payer: Self-pay | Attending: Emergency Medicine | Admitting: Emergency Medicine

## 2013-03-14 ENCOUNTER — Encounter (HOSPITAL_COMMUNITY): Payer: Self-pay | Admitting: *Deleted

## 2013-03-14 DIAGNOSIS — R599 Enlarged lymph nodes, unspecified: Secondary | ICD-10-CM | POA: Insufficient documentation

## 2013-03-14 DIAGNOSIS — R591 Generalized enlarged lymph nodes: Secondary | ICD-10-CM

## 2013-03-14 DIAGNOSIS — F17200 Nicotine dependence, unspecified, uncomplicated: Secondary | ICD-10-CM | POA: Insufficient documentation

## 2013-03-14 DIAGNOSIS — Z87448 Personal history of other diseases of urinary system: Secondary | ICD-10-CM | POA: Insufficient documentation

## 2013-03-14 NOTE — ED Notes (Signed)
Pt with multiple complaints. c/o sore throat x 2 1/2 week. STD exposure, left sided dental pain.

## 2013-03-15 NOTE — ED Notes (Signed)
Chaplain at bedside

## 2013-03-15 NOTE — ED Notes (Signed)
MD at bedside. 

## 2013-03-15 NOTE — ED Provider Notes (Signed)
And the CSN: [id number]     Arrival date & time 03/14/13  2249 History     First MD Initiated Contact with Patient 03/15/13 0019     Chief Complaint  Patient presents with  . Sore Throat   (Consider location/radiation/quality/duration/timing/severity/associated sxs/prior Treatment) Patient is a 32 y.o. female presenting with pharyngitis. The history is provided by the patient.  Sore Throat This is a new problem. Pertinent negatives include no chest pain, no abdominal pain, no headaches and no shortness of breath.   patient's had a sore throat for 3 weeks. She states she's had some swollen lymph nodes 2. She states she's worried that she has HIV. She states she got a call from someone saying that her man had HIV. He states that he does not. He has had negative testing 6 months ago. She has had recent negative STD testing with pelvic exam. This was a few months ago. No fevers. No difficulty breathing. She states she went to the Internet and found swollen lymph nodes can be a sign of HIV. No vaginal bleeding or discharge. No dysuria.  Past Medical History  Diagnosis Date  . Kidney infection    No past surgical history on file. No family history on file. History  Substance Use Topics  . Smoking status: Current Every Day Smoker -- 0.50 packs/day  . Smokeless tobacco: Not on file  . Alcohol Use: Yes     Comment: occasion   OB History   Grav Para Term Preterm Abortions TAB SAB Ect Mult Living                 Review of Systems  Constitutional: Negative for fever, activity change, appetite change and unexpected weight change.  HENT: Positive for sore throat. Negative for neck stiffness.   Eyes: Negative for pain.  Respiratory: Negative for chest tightness and shortness of breath.   Cardiovascular: Negative for chest pain and leg swelling.  Gastrointestinal: Negative for nausea, vomiting, abdominal pain and diarrhea.  Genitourinary: Negative for flank pain.  Musculoskeletal: Negative  for back pain.  Skin: Negative for rash.  Neurological: Negative for weakness, numbness and headaches.  Psychiatric/Behavioral: Negative for behavioral problems.    Allergies  Shellfish allergy  Home Medications  No current outpatient prescriptions on file. BP 133/72  Pulse 70  Temp(Src) 98.1 F (36.7 C) (Oral)  Resp 16  SpO2 100% Physical Exam  Nursing note and vitals reviewed. Constitutional: She is oriented to person, place, and time. She appears well-developed and well-nourished.  HENT:  Head: Normocephalic and atraumatic.  Mouth/Throat: Oropharynx is clear and moist. No oropharyngeal exudate.  Eyes: EOM are normal. Pupils are equal, round, and reactive to light.  Neck: Normal range of motion. Neck supple.  Anterior cervical lymphadenopathy.  Cardiovascular: Normal rate, regular rhythm and normal heart sounds.   No murmur heard. Pulmonary/Chest: Effort normal and breath sounds normal. No respiratory distress. She has no wheezes. She has no rales.  Abdominal: Soft. Bowel sounds are normal. She exhibits no distension. There is no tenderness. There is no rebound and no guarding.  Musculoskeletal: Normal range of motion.  Lymphadenopathy:    She has cervical adenopathy.  Neurological: She is alert and oriented to person, place, and time. No cranial nerve deficit.  Skin: Skin is warm and dry.  Psychiatric: She has a normal mood and affect. Her speech is normal.   left upper molar missing and mildly tender ED Course   Procedures (including critical care time)  Labs Reviewed  RAPID HIV SCREEN ([facility])   No results found. 1. Lymphadenopathy     MDM  Patient concerned about HIV. Negative rapid HIV screen. Does have some lymphadenopathy  Juliet Rude. Rubin Payor, MD 03/15/13 2238

## 2013-03-15 NOTE — ED Notes (Addendum)
Pt is very tearful, pt believes she has been exposed to HIV. Pt received a phone call today that her partner is HIV +. Pt would like to be tested. Pt is very anxious. Pt states she does not want to be changed into a gown at this time.

## 2013-03-15 NOTE — ED Notes (Signed)
Secretary asked to page the chaplain for emotional support for pt.

## 2013-03-17 NOTE — Progress Notes (Signed)
Chaplain visited pt. at Ed room B17 in response to pt. Request to talk to chaplain. Pt. Shared with chaplain that she just took a blood test and was scared of that the result might come positive. Pt.'s body and limbs were shaking the entire visit. Chaplain shared words of comfort and encouragement with pt. Chaplain also prayed with pt. Chaplain noticed pt. appeared to be calm after prayer. Pt later shared with chaplain that the result was negative. Chaplain rejoiced with pt and walked her to the exit of the hospital after her discharge. Pt. thanked chaplain for the emotional and spiritual support. Kelle Darting [phone]   03/15/13 0235  Clinical Encounter Type  Visited With Patient  Visit Type Initial;Spiritual support;ED  Referral From Patient  Spiritual Encounters  Spiritual Needs Prayer;Emotional  Stress Factors  Patient Stress Factors Other (Comment) (anxiety and scared of text result)

## 2013-04-09 ENCOUNTER — Encounter (HOSPITAL_COMMUNITY): Payer: Self-pay | Admitting: *Deleted

## 2013-04-09 ENCOUNTER — Inpatient Hospital Stay (HOSPITAL_COMMUNITY)
Admission: AD | Admit: 2013-04-09 | Discharge: 2013-04-09 | Disposition: A | Discharge status: Home / Self Care | Payer: Self-pay | Source: Ambulatory Visit | Attending: Obstetrics & Gynecology | Admitting: Obstetrics & Gynecology

## 2013-04-09 DIAGNOSIS — Z3201 Encounter for pregnancy test, result positive: Secondary | ICD-10-CM | POA: Insufficient documentation

## 2013-04-09 NOTE — MAU Note (Signed)
Wants pregnancy test

## 2013-04-09 NOTE — MAU Provider Note (Signed)
Attestation of Attending Supervision of Advanced Practitioner (PA/CNM/NP): Evaluation and management procedures were performed by the Advanced Practitioner under my supervision and collaboration.  I have reviewed the Advanced Practitioner's note and chart, and I agree with the management and plan.  Anthany Thornhill, MD, FACOG Attending Obstetrician & Gynecologist Faculty Practice, [facility]  

## 2013-04-09 NOTE — MAU Provider Note (Signed)
Pt in MAU for pregnancy testing and verification letter. She denies any pain or bleeding. Plans abortion tomorrow. 12 weeks 0 days by LMP Carolynn Serve, NP

## 2013-05-12 ENCOUNTER — Inpatient Hospital Stay (HOSPITAL_COMMUNITY)
Admission: AD | Admit: 2013-05-12 | Discharge: 2013-05-12 | Disposition: A | Discharge status: Home / Self Care | Payer: Self-pay | Source: Ambulatory Visit | Attending: Obstetrics & Gynecology | Admitting: Obstetrics & Gynecology

## 2013-05-12 ENCOUNTER — Encounter (HOSPITAL_COMMUNITY): Payer: Self-pay | Admitting: *Deleted

## 2013-05-12 DIAGNOSIS — A5901 Trichomonal vulvovaginitis: Secondary | ICD-10-CM | POA: Insufficient documentation

## 2013-05-12 DIAGNOSIS — IMO0001 Reserved for inherently not codable concepts without codable children: Secondary | ICD-10-CM

## 2013-05-12 DIAGNOSIS — M549 Dorsalgia, unspecified: Secondary | ICD-10-CM | POA: Insufficient documentation

## 2013-05-12 LAB — WET PREP, GENITAL
Clue Cells Wet Prep HPF POC: NONE SEEN
Yeast Wet Prep HPF POC: NONE SEEN

## 2013-05-12 LAB — URINALYSIS, ROUTINE W REFLEX MICROSCOPIC
Glucose, UA: NEGATIVE mg/dL
Leukocytes, UA: NEGATIVE
Specific Gravity, Urine: 1.02 (ref 1.005–1.030)
pH: 7.5 (ref 5.0–8.0)

## 2013-05-12 LAB — POCT PREGNANCY, URINE: Preg Test, Ur: NEGATIVE

## 2013-05-12 LAB — CBC
MCH: 28.8 pg (ref 26.0–34.0)
MCV: 85.7 fL (ref 78.0–100.0)
Platelets: 396 10*3/uL (ref 150–400)
RBC: 4.13 MIL/uL (ref 3.87–5.11)
RDW: 15.9 % — ABNORMAL HIGH (ref 11.5–15.5)

## 2013-05-12 LAB — URINE MICROSCOPIC-ADD ON

## 2013-05-12 MED ORDER — METRONIDAZOLE 500 MG PO TABS
2000.0000 mg | ORAL_TABLET | Freq: Once | ORAL | Status: AC
  Administered 2013-05-12: 2000 mg via ORAL
  Filled 2013-05-12: qty 4

## 2013-05-12 MED ORDER — ONDANSETRON HCL 4 MG PO TABS
8.0000 mg | ORAL_TABLET | Freq: Once | ORAL | Status: AC
  Administered 2013-05-12: 8 mg via ORAL
  Filled 2013-05-12: qty 2

## 2013-05-12 NOTE — MAU Provider Note (Signed)
History     CSN: [id number]  Arrival date and time: 05/12/13 1753   First Provider Initiated Contact with Patient 05/12/13 1910      Chief Complaint  Patient presents with  . Vaginal Bleeding  . Back Pain   HPI  Ms. Kathy Willis is a 32 y.o. female [phone] at [redacted]w[redacted]d who presents with vaginal bleeding and back pain. She has not started prenatal care yet. The bleeding started last month and then stopped, it started again last night; the bleeding is heavy, bright red, few blood clots. Pt rates her pain at this time 0/10.  After pelvic exam patient asked her boyfriend to step out of the room; she informed myself and the RN that she terminated the pregnancy 4 weeks ago and that she is certain this is her menstrual cycle. She does not want her boyfriend to know that she terminated the pregnancy and asked that I keep it a secret. I informed her that I would do a pregnancy test and a beta Hcg, if both are negative and CBC is stable I will discharge her without the need for an [place]. I informed the patient that I am unable to inform the boyfriend of any medical information without her consent.   OB History   Grav Para Term Preterm Abortions TAB SAB Ect Mult Living   9 4 4  4 4    4       History reviewed. No pertinent past medical history.  History reviewed. No pertinent past surgical history.  History reviewed. No pertinent family history.  History  Substance Use Topics  . Smoking status: Current Every Day Smoker -- 0.50 packs/day  . Smokeless tobacco: Not on file  . Alcohol Use: Yes     Comment: occasion    Allergies:  Allergies  Allergen Reactions  . Shellfish Allergy Anaphylaxis    No prescriptions prior to admission   Results for orders placed during the hospital encounter of 05/12/13 (from the past 24 hour(s))  URINALYSIS, ROUTINE W REFLEX MICROSCOPIC     Status: Abnormal   Collection Time    05/12/13  6:10 PM      Result Value Range   Color, Urine YELLOW  YELLOW    APPearance CLEAR  CLEAR   Specific Gravity, Urine 1.020  1.005 - 1.030   pH 7.5  5.0 - 8.0   Glucose, UA NEGATIVE  NEGATIVE mg/dL   Hgb urine dipstick LARGE (*) NEGATIVE   Bilirubin Urine NEGATIVE  NEGATIVE   Ketones, ur NEGATIVE  NEGATIVE mg/dL   Protein, ur NEGATIVE  NEGATIVE mg/dL   Urobilinogen, UA 0.2  0.0 - 1.0 mg/dL   Nitrite NEGATIVE  NEGATIVE   Leukocytes, UA NEGATIVE  NEGATIVE  URINE MICROSCOPIC-ADD ON     Status: None   Collection Time    05/12/13  6:10 PM      Result Value Range   Squamous Epithelial / LPF RARE  RARE   WBC, UA 0-2  <3 WBC/hpf   RBC / HPF 3-6  <3 RBC/hpf   Bacteria, UA RARE  RARE   Urine-Other AMORPHOUS URATES/PHOSPHATES    CBC     Status: Abnormal   Collection Time    05/12/13  7:25 PM      Result Value Range   WBC 8.7  4.0 - 10.5 K/uL   RBC 4.13  3.87 - 5.11 MIL/uL   Hemoglobin 11.9 (*) 12.0 - 15.0 g/dL   HCT 65.7 (*) 84.6 - 96.2 %  MCV 85.7  78.0 - 100.0 fL   MCH 28.8  26.0 - 34.0 pg   MCHC 33.6  30.0 - 36.0 g/dL   RDW 04.5 (*) 40.9 - 81.1 %   Platelets 396  150 - 400 K/uL  HCG, QUANTITATIVE, PREGNANCY     Status: None   Collection Time    05/12/13  7:25 PM      Result Value Range   hCG, Beta Chain, Quant, S <1  <5 mIU/mL  WET PREP, GENITAL     Status: Abnormal   Collection Time    05/12/13  7:26 PM      Result Value Range   Yeast Wet Prep HPF POC NONE SEEN  NONE SEEN   Trich, Wet Prep FEW (*) NONE SEEN   Clue Cells Wet Prep HPF POC NONE SEEN  NONE SEEN   WBC, Wet Prep HPF POC FEW (*) NONE SEEN  POCT PREGNANCY, URINE     Status: None   Collection Time    05/12/13  7:47 PM      Result Value Range   Preg Test, Ur NEGATIVE  NEGATIVE    Review of Systems  Constitutional: Negative for fever and chills.  Gastrointestinal: Positive for nausea. Negative for vomiting, abdominal pain, diarrhea and constipation.  Genitourinary: Negative for dysuria, urgency, frequency and hematuria.  Neurological: Negative for headaches.   Physical Exam    Blood pressure 134/85, pulse 100, temperature 98.1 F (36.7 C), temperature source Oral, resp. rate 16, height 5\' 3"  (1.6 m), weight 49.714 kg (109 lb 9.6 oz), last menstrual period 01/11/2013, SpO2 100.00%.  Physical Exam  Constitutional: She is oriented to person, place, and time. She appears well-developed and well-nourished. No distress.  Neck: Normal range of motion.  Respiratory: Effort normal.  GI: Soft. She exhibits no distension. There is no tenderness. There is no rebound and no guarding.  Genitourinary:  Speculum exam: Vagina - Moderate amount of bright red vaginal blood in vaginal canal, mild odor Cervix - moderate amount of active bleeding from cervical os Bimanual exam: Cervix FT, thick, posterior  Uterus non tender, normal size; enlarged  Adnexa non tender, no masses bilaterally GC/Chlam, wet prep done Chaperone present for exam.   Musculoskeletal: Normal range of motion.  Neurological: She is alert and oriented to person, place, and time.  Skin: Skin is warm and dry. She is not diaphoretic.  Psychiatric: Her mood appears anxious. She exhibits a depressed mood.    MAU Course  Procedures None   MDM Unable to doppler fetal heart tones  UA Beta Hcg CBC Wet prep GC/Chlamydia   Assessment and Plan  Report given Ivonne Andrew CNM who resumes care of this patient  Debbrah Alar FNP-C 05/12/2013, 8:23 PM   Care of pt assumed by Dorathy Kinsman, CNM at 2000.  Pt informed of quant <1 and Trich Dx. Tearful. Admitted that bleeding stopped the day after termination and started last night like a normal period. Flagyl ordered. Pt asking why tests last month did not show trich. Explained that I do not know what tests were done, but that wet prep has a high false-positive rate. Pt also admits that she a her partner where on a break and she noticed vaginal odor after they resumed IC.   Pt asked CNM to explain trich and difficulty pin-pointing time of transmission to  her partner. Pt and partner were speaking in raised voiced. CNM explained trich. Recommended partner Tx and no IC until 1 week after  both Tx'd. Offered pt Depo, declined. Offered pt transportation home due to concerns about the situation btw she and hr partner (although he was not verbally abusive). Pt declined.   Assessment: 1. Trichomonas vaginitis   2. Onset of menses    Plan: D/C home in stable condition.  Partner needs TX.   Medication List    Notice   You have not been prescribed any medications.     Follow-up Information   Follow up with Gynecologist. (for contraception and routine care)       Follow up with [facility] [place] [facility]. (for emergencies)    Contact information:   [address] [id number] [place] [address] [phone]    List of Gyns given.  Strongly encouraged pt to use [facility].   [place], CNM 05/12/2013 9:44 PM

## 2013-05-12 NOTE — MAU Note (Signed)
Unable to isolate a fetal heart beat in triage. Hearing sounds unsure if fetal or bowels.

## 2013-05-12 NOTE — MAU Note (Signed)
Pt found out she was pregnant at [redacted] weeks (on August 28th) and had some spotting after that and bleeding has gotten heavier lately. "Concerned because not feeling fetal movement and belly is not growing. "

## 2013-05-12 NOTE — MAU Note (Signed)
Pt was offered alternate transportation home, and declined. Pt also states she feels safe

## 2013-05-12 NOTE — MAU Note (Signed)
Patient states she was seen about one month ago for bleeding. State she started bleeding like a period yesterday. Small amount of back pain. Patient states she feels like she may have lost the pregnancy and has not felt movement.

## 2013-07-31 ENCOUNTER — Emergency Department (HOSPITAL_COMMUNITY)
Admission: EM | Admit: 2013-07-31 | Discharge: 2013-07-31 | Disposition: A | Discharge status: Home / Self Care | Payer: Self-pay | Attending: Emergency Medicine | Admitting: Emergency Medicine

## 2013-07-31 DIAGNOSIS — F17200 Nicotine dependence, unspecified, uncomplicated: Secondary | ICD-10-CM | POA: Insufficient documentation

## 2013-07-31 DIAGNOSIS — K089 Disorder of teeth and supporting structures, unspecified: Secondary | ICD-10-CM | POA: Insufficient documentation

## 2013-07-31 DIAGNOSIS — K0889 Other specified disorders of teeth and supporting structures: Secondary | ICD-10-CM

## 2013-07-31 MED ORDER — PENICILLIN V POTASSIUM 500 MG PO TABS: 500.0000 mg | ORAL_TABLET | Freq: Four times a day (QID) | ORAL | Status: AC

## 2013-07-31 MED ORDER — TRAMADOL HCL 50 MG PO TABS: 10.0000 mg | ORAL_TABLET | Freq: Four times a day (QID) | ORAL | Status: DC | PRN

## 2013-07-31 NOTE — ED Notes (Signed)
Pt c/o pus drainage from a dental infection to the bottom back R side of her mouth. Pt also c/o sore throat. Pt states she has a foul taste in her mouth. Symptoms for the past 2 days. Pt does not have a dentist. Pt with no acute distress.

## 2013-07-31 NOTE — ED Provider Notes (Signed)
CSN: [id number]     Arrival date & time 07/31/13  1803 History  This chart was scribed for Santiago Glad, PA-C, working with Dagmar Hait, MD by Blanchard Kelch, ED Scribe. This patient was seen in room WTR9/WTR9 and the patient's care was started at 7:49 PM.    Chief Complaint  Patient presents with  . Dental Infection     The history is provided by the patient. No language interpreter was used.    HPI Comments: Kathy Willis is a 32 y.o. female who presents to the Emergency Department complaining of intermittent bottom right dental pain that began about a year ago but worsened two days ago. She reports a foul odor and taste from the area.  . She denies difficulty swallowing. She has been gargling with mouthwash and peroxide without relief. She denies taking any medication for the pain. She has been prescribed antibiotics for the tooth in the past with temporary relief of pain but has not been able to get it pulled because she couldn't afford it. She denies facial swelling, fever, or chills.  She currently does not have a dentist.   No past medical history on file. No past surgical history on file. No family history on file. History  Substance Use Topics  . Smoking status: Current Every Day Smoker -- 0.50 packs/day  . Smokeless tobacco: Not on file  . Alcohol Use: Yes     Comment: occasion   OB History   Grav Para Term Preterm Abortions TAB SAB Ect Mult Living   9 4 4  4 4    4      Review of Systems  Constitutional: Negative for fever.  HENT: Positive for dental problem. Negative for facial swelling.   All other systems reviewed and are negative.    Allergies  Shellfish allergy  Home Medications  No current outpatient prescriptions on file. Triage Vitals: BP 120/76  Pulse 87  Temp(Src) 98.3 F (36.8 C) (Oral)  Resp 16  SpO2 100%  LMP 01/11/2013  Physical Exam  Nursing note and vitals reviewed. Constitutional: She is oriented to person, place, and time.  She appears well-developed and well-nourished. No distress.  HENT:  Head: Normocephalic and atraumatic.  Mouth/Throat: Uvula is midline, oropharynx is clear and moist and mucous membranes are normal. No trismus in the jaw. Abnormal dentition. No dental abscesses or uvula swelling. No oropharyngeal exudate, posterior oropharyngeal edema, posterior oropharyngeal erythema or tonsillar abscesses.  Poor dental hygiene. Pt able to open and close mouth with out difficulty. Airway intact. Uvula midline. Mild gingival swelling with tenderness over affected area, but no fluctuance. No swelling or tenderness of submental and submandibular regions.  No sublingual tenderness or tongue elevation.  Eyes: Conjunctivae and EOM are normal.  Neck: Normal range of motion and full passive range of motion without pain. Neck supple. No tracheal deviation present.  Cardiovascular: Normal rate, regular rhythm and normal heart sounds.   Pulmonary/Chest: Effort normal and breath sounds normal. No stridor. No respiratory distress. She has no wheezes.  Musculoskeletal: Normal range of motion.  Lymphadenopathy:       Head (right side): No submental, no submandibular, no tonsillar, no preauricular and no posterior auricular adenopathy present.       Head (left side): No submental, no submandibular, no tonsillar, no preauricular and no posterior auricular adenopathy present.    She has no cervical adenopathy.  Neurological: She is alert and oriented to person, place, and time.  Skin: Skin is warm and  dry. No rash noted. She is not diaphoretic.  Psychiatric: She has a normal mood and affect. Her behavior is normal.    ED Course  Procedures (including critical care time)  DIAGNOSTIC STUDIES: Oxygen Saturation is 100% on room air, normal by my interpretation.    COORDINATION OF CARE: 7:54 PM -Will treat infected tooth with penicillin and refer to dentist. Patient verbalizes understanding and agrees with treatment  plan.   Labs Review Labs Reviewed - No data to display Imaging Review No results found.  EKG Interpretation   None       MDM  No diagnosis found. Patient with toothache.  No gross abscess.  Exam unconcerning for Ludwig's angina or spread of infection.  Will treat with penicillin and pain medicine.  Urged patient to follow-up with dentist.    I personally performed the services described in this documentation, which was scribed in my presence. The recorded information has been reviewed and is accurate.    Santiago Glad, PA-C 07/31/13 2012

## 2013-07-31 NOTE — ED Provider Notes (Signed)
Medical screening examination/treatment/procedure(s) were performed by non-physician practitioner and as supervising physician I was immediately available for consultation/collaboration.  EKG Interpretation   None         Dagmar Hait, MD 07/31/13 2249

## 2014-02-12 ENCOUNTER — Encounter (HOSPITAL_COMMUNITY): Payer: Self-pay | Admitting: *Deleted

## 2014-03-19 ENCOUNTER — Encounter (HOSPITAL_COMMUNITY): Payer: Self-pay | Admitting: Emergency Medicine

## 2014-03-19 ENCOUNTER — Emergency Department (HOSPITAL_COMMUNITY)
Admission: EM | Admit: 2014-03-19 | Discharge: 2014-03-19 | Disposition: A | Discharge status: Home / Self Care | Payer: Self-pay | Attending: Emergency Medicine | Admitting: Emergency Medicine

## 2014-03-19 DIAGNOSIS — F1310 Sedative, hypnotic or anxiolytic abuse, uncomplicated: Secondary | ICD-10-CM | POA: Insufficient documentation

## 2014-03-19 DIAGNOSIS — R45851 Suicidal ideations: Secondary | ICD-10-CM | POA: Insufficient documentation

## 2014-03-19 DIAGNOSIS — Z3202 Encounter for pregnancy test, result negative: Secondary | ICD-10-CM | POA: Insufficient documentation

## 2014-03-19 DIAGNOSIS — Z791 Long term (current) use of non-steroidal anti-inflammatories (NSAID): Secondary | ICD-10-CM | POA: Insufficient documentation

## 2014-03-19 DIAGNOSIS — F1210 Cannabis abuse, uncomplicated: Secondary | ICD-10-CM | POA: Insufficient documentation

## 2014-03-19 DIAGNOSIS — F3289 Other specified depressive episodes: Secondary | ICD-10-CM | POA: Insufficient documentation

## 2014-03-19 DIAGNOSIS — F410 Panic disorder [episodic paroxysmal anxiety] without agoraphobia: Secondary | ICD-10-CM | POA: Insufficient documentation

## 2014-03-19 DIAGNOSIS — R45850 Homicidal ideations: Secondary | ICD-10-CM | POA: Insufficient documentation

## 2014-03-19 DIAGNOSIS — F32A Depression, unspecified: Secondary | ICD-10-CM

## 2014-03-19 DIAGNOSIS — F17200 Nicotine dependence, unspecified, uncomplicated: Secondary | ICD-10-CM | POA: Insufficient documentation

## 2014-03-19 DIAGNOSIS — F329 Major depressive disorder, single episode, unspecified: Secondary | ICD-10-CM | POA: Insufficient documentation

## 2014-03-19 LAB — CBC WITH DIFFERENTIAL/PLATELET
BASOS ABS: 0 10*3/uL (ref 0.0–0.1)
Basophils Relative: 0 % (ref 0–1)
Eosinophils Absolute: 0.3 10*3/uL (ref 0.0–0.7)
Eosinophils Relative: 4 % (ref 0–5)
HCT: 33.1 % — ABNORMAL LOW (ref 36.0–46.0)
Hemoglobin: 10.9 g/dL — ABNORMAL LOW (ref 12.0–15.0)
Lymphocytes Relative: 23 % (ref 12–46)
Lymphs Abs: 1.9 10*3/uL (ref 0.7–4.0)
MCH: 29.8 pg (ref 26.0–34.0)
MCHC: 32.9 g/dL (ref 30.0–36.0)
MCV: 90.4 fL (ref 78.0–100.0)
Monocytes Absolute: 0.4 10*3/uL (ref 0.1–1.0)
Monocytes Relative: 4 % (ref 3–12)
Neutro Abs: 5.7 10*3/uL (ref 1.7–7.7)
Neutrophils Relative %: 69 % (ref 43–77)
PLATELETS: 332 10*3/uL (ref 150–400)
RBC: 3.66 MIL/uL — AB (ref 3.87–5.11)
RDW: 14.5 % (ref 11.5–15.5)
WBC: 8.4 10*3/uL (ref 4.0–10.5)

## 2014-03-19 LAB — COMPREHENSIVE METABOLIC PANEL
ALK PHOS: 68 U/L (ref 39–117)
ALT: 11 U/L (ref 0–35)
AST: 16 U/L (ref 0–37)
Albumin: 3.5 g/dL (ref 3.5–5.2)
Anion gap: 10 (ref 5–15)
BUN: 10 mg/dL (ref 6–23)
CALCIUM: 8.3 mg/dL — AB (ref 8.4–10.5)
CO2: 24 meq/L (ref 19–32)
Chloride: 108 mEq/L (ref 96–112)
Creatinine, Ser: 0.67 mg/dL (ref 0.50–1.10)
GFR calc non Af Amer: >90 mL/min (ref >90)
GLUCOSE: 87 mg/dL (ref 70–99)
POTASSIUM: 3.8 meq/L (ref 3.7–5.3)
SODIUM: 142 meq/L (ref 137–147)
TOTAL PROTEIN: 6.3 g/dL (ref 6.0–8.3)
Total Bilirubin: 0.4 mg/dL (ref 0.3–1.2)

## 2014-03-19 LAB — ETHANOL: Alcohol, Ethyl (B): <11 mg/dL (ref 0–11)

## 2014-03-19 LAB — RAPID URINE DRUG SCREEN, HOSP PERFORMED
Amphetamines: NOT DETECTED
BARBITURATES: NOT DETECTED
Benzodiazepines: POSITIVE — AB
Cocaine: NOT DETECTED
Opiates: NOT DETECTED
TETRAHYDROCANNABINOL: POSITIVE — AB

## 2014-03-19 LAB — POC URINE PREG, ED: Preg Test, Ur: NEGATIVE

## 2014-03-19 MED ORDER — LORAZEPAM 1 MG PO TABS: 1.0000 mg | ORAL_TABLET | Freq: Three times a day (TID) | ORAL | Status: DC | PRN

## 2014-03-19 MED ORDER — LORAZEPAM 1 MG PO TABS
1.0000 mg | ORAL_TABLET | Freq: Once | ORAL | Status: AC
  Administered 2014-03-19: 1 mg via ORAL
  Filled 2014-03-19: qty 1

## 2014-03-19 MED ORDER — ALUM & MAG HYDROXIDE-SIMETH 200-200-20 MG/5ML PO SUSP: 30.0000 mL | ORAL | Status: DC | PRN

## 2014-03-19 MED ORDER — IBUPROFEN 200 MG PO TABS: 600.0000 mg | ORAL_TABLET | Freq: Three times a day (TID) | ORAL | Status: DC | PRN

## 2014-03-19 MED ORDER — ACETAMINOPHEN 325 MG PO TABS: 650.0000 mg | ORAL_TABLET | ORAL | Status: DC | PRN

## 2014-03-19 MED ORDER — ONDANSETRON HCL 4 MG PO TABS: 4.0000 mg | ORAL_TABLET | Freq: Three times a day (TID) | ORAL | Status: DC | PRN

## 2014-03-19 MED ORDER — NICOTINE 21 MG/24HR TD PT24: 21.0000 mg | MEDICATED_PATCH | Freq: Every day | TRANSDERMAL | Status: DC

## 2014-03-19 NOTE — ED Provider Notes (Signed)
TIME SEEN: 8:24 AM  CHIEF COMPLAINT: Anxiety attacks, homicidal  HPI: Patient is a 33 y.o. F with no significant past medical history who presents to the emergency department with anxiety attack at work today and feeling homicidal. Patient reports that she has been depressed for several months, crying everyday. She states she has had suicidal thoughts intermittently but none currently. No prior history of suicide attempt. She states that she feels like she gets mad very easily and wants to hurt people. She is here voluntarily. Denies any medical complaints. She is not on any psychiatric medications. Denies any drug or alcohol use.  ROS: See HPI Constitutional: no fever  Eyes: no drainage  ENT: no runny nose   Cardiovascular:  no chest pain  Resp: no SOB  GI: no vomiting GU: no dysuria Integumentary: no rash  Allergy: no hives  Musculoskeletal: no leg swelling  Neurological: no slurred speech ROS otherwise negative  PAST MEDICAL HISTORY/PAST SURGICAL HISTORY:  History reviewed. No pertinent past medical history.  MEDICATIONS:  Prior to Admission medications   Medication Sig Start Date End Date Taking? Authorizing Provider  ibuprofen (ADVIL,MOTRIN) 200 MG tablet Take 200 mg by mouth every 6 (six) hours as needed for headache or moderate pain.   Yes Historical Provider, MD    ALLERGIES:  Allergies  Allergen Reactions  . Shellfish Allergy Anaphylaxis    SOCIAL HISTORY:  History  Substance Use Topics  . Smoking status: Current Every Day Smoker -- 0.50 packs/day  . Smokeless tobacco: Not on file  . Alcohol Use: Yes     Comment: occasion    FAMILY HISTORY: History reviewed. No pertinent family history.  EXAM: BP 140/84  Pulse 57  Temp(Src) 97.3 F (36.3 C) (Oral)  Resp 16  Ht 5\' 5"  (1.651 m)  Wt 98 lb (44.453 kg)  BMI 16.31 kg/m2  SpO2 100%  LMP 03/13/2014 CONSTITUTIONAL: Alert and oriented and responds appropriately to questions. Well-appearing;  well-nourished HEAD: Normocephalic EYES: Conjunctivae clear, PERRL ENT: normal nose; no rhinorrhea; moist mucous membranes; pharynx without lesions noted NECK: Supple, no meningismus, no LAD  CARD: RRR; S1 and S2 appreciated; no murmurs, no clicks, no rubs, no gallops RESP: Normal chest excursion without splinting or tachypnea; breath sounds clear and equal bilaterally; no wheezes, no rhonchi, no rales,  ABD/GI: Normal bowel sounds; non-distended; soft, non-tender, no rebound, no guarding BACK:  The back appears normal and is non-tender to palpation, there is no CVA tenderness EXT: Normal ROM in all joints; non-tender to palpation; no edema; normal capillary refill; no cyanosis    SKIN: Normal color for age and race; warm NEURO: Moves all extremities equally PSYCH: Tearful, denies SI currently but has had suicidal thoughts in the past. Reports she is having feelings of wanting to hurt other people. No hallucinations.  MEDICAL DECISION MAKING: Patient here with anxiety attack, homicidal ideation. No current medical complaints. We'll obtain screening labs and urine. Will consult TTS for further evaluation. She is here voluntarily.  ED PROGRESS: Pt's labs unremarkable.  UDS positive for benzos and THC.  TTS consult pending.   11:15 AM  Pt has been seen by behavioral health. They feel she is safe to be discharged home with outpatient follow[facility]. She contracts for safety. We'll discharge with prescription for Ativan to use as needed for anxiety. Discussed return precautions. Patient verbalizes understanding is comfortable plan. Family is at bedside to take her home.  Layla MawKristen N Kimber Esterly, DO 03/19/14 1115

## 2014-03-19 NOTE — BH Assessment (Signed)
This Clinical research associatewriter completed a tele assessment with patient. She denies SI, HI, and AVH's. Writer discussed clinicals with psychiatrist-Dr. Elsie SaasJonnalagadda. He recommended discharge home with [facility]. Writer discussed disposition with EDP-Dr. Ward and patient's nurse-Morgan. Writer will fax information (phone number, address, etc.) to patient's nurse and this is to be given to the patient prior to discharge.

## 2014-03-19 NOTE — ED Notes (Signed)
Pt refusing to place paper scrubs, per dr ward, pt is able to stay in her own clothes until tts

## 2014-03-19 NOTE — ED Notes (Signed)
[facility] and sitter is on way, charge aware.

## 2014-03-19 NOTE — ED Notes (Signed)
Pt reports that she is not currently suicidal at this time.

## 2014-03-19 NOTE — Discharge Instructions (Signed)
Depression °Depression refers to feeling sad, low, down in the dumps, blue, gloomy, or empty. In general, there are two kinds of depression: °1. Normal sadness or normal grief. This kind of depression is one that we all feel from time to time after upsetting life experiences, such as the loss of a job or the ending of a relationship. This kind of depression is considered normal, is short lived, and resolves within a few days to 2 weeks. Depression experienced after the loss of a loved one (bereavement) often lasts longer than 2 weeks but normally gets better with time. °2. Clinical depression. This kind of depression lasts longer than normal sadness or normal grief or interferes with your ability to function at home, at work, and in school. It also interferes with your personal relationships. It affects almost every aspect of your life. Clinical depression is an illness. °Symptoms of depression can also be caused by conditions other than those mentioned above, such as: °· Physical illness. Some physical illnesses, including underactive thyroid gland (hypothyroidism), severe anemia, specific types of cancer, diabetes, uncontrolled seizures, heart and lung problems, strokes, and chronic pain are commonly associated with symptoms of depression. °· Side effects of some prescription medicine. In some people, certain types of medicine can cause symptoms of depression. °· Substance abuse. Abuse of alcohol and illicit drugs can cause symptoms of depression. °SYMPTOMS °Symptoms of normal sadness and normal grief include the following: °· Feeling sad or crying for short periods of time. °· Not caring about anything (apathy). °· Difficulty sleeping or sleeping too much. °· No longer able to enjoy the things you used to enjoy. °· Desire to be by oneself all the time (social isolation). °· Lack of energy or motivation. °· Difficulty concentrating or remembering. °· Change in appetite or weight. °· Restlessness or  agitation. °Symptoms of clinical depression include the same symptoms of normal sadness or normal grief and also the following symptoms: °· Feeling sad or crying all the time. °· Feelings of guilt or worthlessness. °· Feelings of hopelessness or helplessness. °· Thoughts of suicide or the desire to harm yourself (suicidal ideation). °· Loss of touch with reality (psychotic symptoms). Seeing or hearing things that are not real (hallucinations) or having false beliefs about your life or the people around you (delusions and paranoia). °DIAGNOSIS  °The diagnosis of clinical depression is usually based on how bad the symptoms are and how long they have lasted. Your health care provider will also ask you questions about your medical history and substance use to find out if physical illness, use of prescription medicine, or substance abuse is causing your depression. Your health care provider may also order blood tests. °TREATMENT  °Often, normal sadness and normal grief do not require treatment. However, sometimes antidepressant medicine is given for bereavement to ease the depressive symptoms until they resolve. °The treatment for clinical depression depends on how bad the symptoms are but often includes antidepressant medicine, counseling with a mental health professional, or both. Your health care provider will help to determine what treatment is best for you. °Depression caused by physical illness usually goes away with appropriate medical treatment of the illness. If prescription medicine is causing depression, talk with your health care provider about stopping the medicine, decreasing the dose, or changing to another medicine. °Depression caused by the abuse of alcohol or illicit drugs goes away when you stop using these substances. Some adults need professional help in order to stop drinking or using drugs. °SEEK IMMEDIATE MEDICAL   CARE IF:  You have thoughts about hurting yourself or others.  You lose touch  with reality (have psychotic symptoms).  You are taking medicine for depression and have a serious side effect. FOR MORE INFORMATION  National Alliance on Mental Illness: www.nami.[employer] of Mental Health: https://carter.com/ Document Released: 07/27/2000 Document Revised: 12/14/2013 Document Reviewed: 10/29/2011 [facility] Patient Information 2015 [place], [place]. This information is not intended to replace advice given to you by your health care provider. Make sure you discuss any questions you have with your health care provider.  Panic Attacks Panic attacks are sudden, short-livedsurges of severe anxiety, fear, or discomfort. They may occur for no reason when you are relaxed, when you are anxious, or when you are sleeping. Panic attacks may occur for a number of reasons:   Healthy people occasionally have panic attacks in extreme, life-threatening situations, such as war or natural disasters. Normal anxiety is a protective mechanism of the body that helps [place] react to danger (fight or flight response).  Panic attacks are often seen with anxiety disorders, such as panic disorder, social anxiety disorder, generalized anxiety disorder, and phobias. Anxiety disorders cause excessive or uncontrollable anxiety. They may interfere with your relationships or other life activities.  Panic attacks are sometimes seen with other mental illnesses, such as depression and posttraumatic stress disorder.  Certain medical conditions, prescription medicines, and drugs of abuse can cause panic attacks. SYMPTOMS  Panic attacks start suddenly, peak within 20 minutes, and are accompanied by four or more of the following symptoms:  Pounding heart or fast heart rate (palpitations).  Sweating.  Trembling or shaking.  Shortness of breath or feeling smothered.  Feeling choked.  Chest pain or discomfort.  Nausea or strange feeling in your stomach.  Dizziness, light-headedness, or feeling  like you will faint.  Chills or hot flushes.  Numbness or tingling in your lips or hands and feet.  Feeling that things are not real or feeling that you are not yourself.  Fear of losing control or going crazy.  Fear of dying. Some of these symptoms can mimic serious medical conditions. For example, you may think you are having a heart attack. Although panic attacks can be very scary, they are not life threatening. DIAGNOSIS  Panic attacks are diagnosed through an assessment by your health care provider. Your health care provider will ask questions about your symptoms, such as where and when they occurred. Your health care provider will also ask about your medical history and use of alcohol and drugs, including prescription medicines. Your health care provider may order blood tests or other studies to rule out a serious medical condition. Your health care provider may refer you to a mental health professional for further evaluation. TREATMENT   Most healthy people who have one or two panic attacks in an extreme, life-threatening situation will not require treatment.  The treatment for panic attacks associated with anxiety disorders or other mental illness typically involves counseling with a mental health professional, medicine, or a combination of both. Your health care provider will help determine what treatment is best for you.  Panic attacks due to physical illness usually go away with treatment of the illness. If prescription medicine is causing panic attacks, talk with your health care provider about stopping the medicine, decreasing the dose, or substituting another medicine.  Panic attacks due to alcohol or drug abuse go away with abstinence. Some adults need professional help in order to stop drinking or using drugs. HOME CARE INSTRUCTIONS  Take all medicines as directed by your health care provider.   Schedule and attend follow-up visits as directed by your health care  provider. It is important to keep all your appointments. SEEK MEDICAL CARE IF:  You are not able to take your medicines as prescribed.  Your symptoms do not improve or get worse. SEEK IMMEDIATE MEDICAL CARE IF:   You experience panic attack symptoms that are different than your usual symptoms.  You have serious thoughts about hurting yourself or others.  You are taking medicine for panic attacks and have a serious side effect. MAKE SURE YOU:  Understand these instructions.  Will watch your condition.  Will get help right away if you are not doing well or get worse. Document Released: 07/30/2005 Document Revised: 08/04/2013 Document Reviewed: 03/13/2013 [facility] Patient Information 2015 [place], [place]. This information is not intended to replace advice given to you by your health care provider. Make sure you discuss any questions you have with your health care provider.  No-harm Safety Contract  A no-harm safety contract is a written or verbal agreement between you and a mental health professional to promote safety. It contains specific actions and promises you agree to. The agreement also includes instructions from the therapist or doctor. The instructions will help prevent you from harming yourself or harming others. Harm can be as mild as pinching yourself, but can increase in intensity to actions like burning or cutting yourself. The extreme level of self-harm would be committing suicide. No-harm safety contracts are also sometimes referred to as a Charity fundraiser, suicide Financial controller, no-harm agreements or decisions, or a Engineer, manufacturing systems.  REASONS FOR NO-HARM SAFETY CONTRACTS Safety contracts are just one part of an overall treatment plan to help keep you safe and free of harm. A safety contract may help to relieve anxiety, restore a sense of control, state clearly the alternatives to harm or suicide, and give you and your therapist or doctor a gauge for how you are doing  in between visits. Many factors impact the decision to use a no-harm safety contract and its effectiveness. A proper overall treatment plan and evaluation and good patient understanding are the keys to good outcomes. CONTRACT ELEMENTS  A contract can range from simple to complex. They include all or some of the following:  Action statements. These are statements you agree to do or not do. Example: If I feel my life is becoming too difficult, I agree to do the following so there is no harm to myself or others:  Talk with family or friends.  Rid myself of all things that I could use to harm myself.  Do an activity I enjoy or have enjoyed in the recent past. Coping strategies. These are ways to think and feel that decrease stress, such as:  Use of affirmations or positive statements about self.  Good self-care, including improved grooming, and healthy eating, and healthy sleeping patterns.  Increase physical exercise.  Increase social involvement.  Focus on positive aspects of life. Crisis management. This would include what to do if there was trouble following the contract or an urge to harm. This might include notifying family or your therapist of suicidal thoughts. Be open and honest about suicidal urges. To prevent a crisis, do the following:  List reasons to reach out for support.  Keep contact numbers and available hours handy. Treatment goals. These are goals would include no suicidal thoughts, improved mood, and feelings of hopefulness. Listed responsibilities of different people involved in  care. This could include family members. A family member may agree to remove firearms or other lethal weapons/substances from your ease of access. A timeline. A timeline can be in place from one therapy session to the next session. HOME CARE INSTRUCTIONS   Follow your no-harm safety contract.  Contact your therapist and/or doctor if you have any questions or concerns. MAKE SURE YOU:    Understand these instructions.  Will watch your condition. Noticing any mood changes or suicidal urges.  Will get help right away if you are not doing well or get worse. Document Released: 01/17/2010 Document Revised: 10/22/2011 Document Reviewed: 01/17/2010 [facility] Patient Information 2015 [place]. This information is not intended to replace advice given to you by your health care provider. Make sure you discuss any questions you have with your health care provider.     Emergency Department Resource Guide 1) Find a Doctor and Pay Out of Pocket Although you won't have to find out who is covered by your insurance plan, it is a good idea to ask around and get recommendations. You will then need to call the office and see if the doctor you have chosen will accept you as a new patient and what types of options they offer for patients who are self-pay. Some doctors offer discounts or will set up payment plans for their patients who do not have insurance, but you will need to ask so you aren't surprised when you get to your appointment.  2) Contact Your Local Health Department Not all health departments have doctors that can see patients for sick visits, but many do, so it is worth a call to see if yours does. If you don't know where your local health department is, you can check in your phone book. The CDC also has a tool to help you locate your state's health department, and many state websites also have listings of all of their local health departments.  3) Find a Walk-in Clinic If your illness is not likely to be very severe or complicated, you may want to try a walk in clinic. These are popping up all over the country in pharmacies, drugstores, and shopping centers. They're usually staffed by nurse practitioners or physician assistants that have been trained to treat common illnesses and complaints. They're usually fairly quick and inexpensive. However, if you have serious medical  issues or chronic medical problems, these are probably not your best option.  No Primary Care Doctor: - Call Health Connect at  [phone] - they can help you locate a primary care doctor that  accepts your insurance, provides certain services, etc. - Physician Referral Service- [phone]  Chronic Pain Problems: Organization         Address  Phone   Notes  [facility]  [phone] Patients need to be referred by their primary care doctor.   Medication Assistance: Organization         Address  Phone   Notes  [facility] [address] [place], [place] [phone] --Must be a resident of [facility] -- Must have NO insurance coverage whatsoever (no Medicaid/ Medicare, etc.) -- The pt. MUST have a primary care doctor that directs their care regularly and follows them in the community   MedAssist  [phone]   [employer]  [phone]    Agencies that provide inexpensive medical care: Retail buyerrganization         Address  Phone  Notes  [facility]  [phone]   [facility]    [phone]   [facility] [address] [place], [place] [address] [phone]   [facility] [address] [place] [place] [phone]   Planned Parenthood    [phone]   [facility]    [phone]   [facility]  [address], [place] Phone:  [phone], Fax:  [phone] Hours of Operation:  9 am - 6 pm, M-F.  Also accepts Medicaid/Medicare and self-pay.  [facility]  [address], [place] Phone: [phone], Fax: [phone]. Hours of Operation:  8:30 am - 5:30 pm, M-F.  Also accepts Medicaid and self-pay.  [facility] [place] [address], [place] Phone: [phone]   [facility] [address] Natasha Bence [place], [place]  [phone], Ext. [phone] Mondays & Thursdays: 7-9 AM.  First 15 patients are seen on a first come, first serve basis.    Medicaid-accepting [facility] Providers:  Organization         Address  Phone   Notes  [facility] [address], [place] [phone] Also accepts self-pay patients.  [facility] [address] Laurell Josephs [place], [place]  [phone]   [facility] [address], [place] [phone]   [facility] [address] [place] [phone]   Renaye Rakers [address], [place]   [phone] Only accepts [place] Access [place] patients after they have their name applied to their card.   Self-Pay (no insurance) in [facility]:  Organization         Address  Phone   Notes  Sickle Cell Patients, [facility] Internal Medicine [address] [place], [place] [phone]   [facility] [address] [place], [place] [phone]   [facility] [place]  [address], [place] [phone]   [facility]/Dr. Osei-Bonsu  [address] [place] or [address], [place] [phone] Phone number for both [place] and [place] locations is the same.  [facility] [address], [place] [phone]   [facility] [address] [place] or [address] [phone]   [facility] [address] [place] [phone], phone; [phone], fax Sees patients 1st and 3rd Saturday of every month.  Must not qualify for public or private insurance (i.e. Medicaid, Medicare, [place] Health Choice, Veterans' Benefits)  Household income should be no more than 200% of the poverty level The clinic cannot treat you if you are pregnant or think you are pregnant  Sexually transmitted  diseases are not treated at the clinic.    Dental Care: Organization         Address  Phone  Notes  [facility] [address] [place], [place] [phone] Accepts children up to age 74 who are enrolled in [place] or [place] Health Choice; pregnant women with a Medicaid card; and children who have applied for Medicaid or [place] Health Choice, but were declined, whose parents can pay a reduced fee at time of service.  [facility]  Department of [facility]  [address], [place] [phone] Accepts children up to age 62 who are enrolled in [place] or [place] Health Choice; pregnant women with a Medicaid card; and children who have applied for Medicaid or [place] Health Choice, but were declined, whose parents can pay a reduced fee at time of service.  [facility]  [address] [place], [place] [phone] Patients are seen by appointment only. Walk-ins are not accepted. [facility] will see patients 38 years of age and older. Monday - Tuesday (8am-5pm) Most Wednesdays (8:30-5pm) $30 per visit, cash only  [facility] Adult [facility] Access PRO[facility]  [address], [facility] [phone] Patients are seen by appointment only. Walk-ins are not accepted. [facility] will see patients 58 years of age and older. One Wednesday Evening (Monthly: Volunteer Based).  $30 per visit, cash only  [employer]  [phone] for adults; Children under age 62, call Graduate Pediatric Dentistry at [phone]. Children aged 79-14, please call [phone] to request a pediatric application.  Dental services are provided in all areas of dental care including fillings, crowns and bridges, complete and partial dentures, implants, gum treatment, root canals, and extractions. Preventive care is also provided. Treatment is provided to both adults and children. Patients are selected via a  lottery and there is often a waiting list.   [facility] [address], [place]  [phone] [email or web address]   [facility] [address] [place], [place] [phone], Ext. [phone] Second and Fourth Thursday of each month, opens at 6:30 AM; Clinic ends at 9 AM.  Patients are seen on a first-come first-served basis, and a limited number are seen during each clinic.   [facility]  [address] Ether Griffins [place], [place] [phone]   Eligibility Requirements You must have lived in [place], [place], or [place] counties for at least the last three months.   You cannot be eligible for state or federal sponsored [place], including [employer], [place], or [employer].   You generally cannot be eligible for healthcare insurance through your employer.    How to apply: Eligibility screenings are held every Tuesday and Wednesday afternoon from 1:00 pm until 4:00 pm. You do not need an appointment for the interview!  [facility] [address], [place], [place] [phone]   [facility] Health Department  [phone]   [facility] Health Department  [phone]   [facility] Health Department  [phone]    Behavioral Health Resources in the Community: Intensive Outpatient Programs Organization         Address  Phone  Notes  [facility] [address], [place], [place] [phone]   [facility] Outpatient [address] [place], [place] [phone]   [facility] [address] [place], [place]  [phone]   [facility] Mental Health [address],  [place], [place] [phone] or [phone]   Substance Abuse Resources Organization         Address  Phone  Notes  [facility]  [phone]   [facility]  [phone]   [facility]  [phone]   Floydene Flock  [phone]   Residential &  Outpatient Substance Abuse Program  [phone]   Psychological Services Organization         Address  Phone  Notes  [facility]  [place]   [place] [address], [place] or [phone]    Mobile Crisis Teams Organization         Address  Phone  Notes  [facility], Mobile Crisis Care Unit  [phone]   [facility]  [address] [place], [place]   [place] [address] [place] [phone]    Self-Help/Support Groups Organization         Address  Phone             Notes  [place]. of [place] - variety of support groups  [place] Call for more information  Narcotics Anonymous (NA), [facility] [address], [employer] [place]  2 meetings at this location   Special educational needs teacher         Address  Phone  Notes  [facility] [place],    [place]  [phone]   [facility]  [address] [place] [phone], [place], [place]   [place], [place] [phone] Admissions: 8am-3pm M-F  Incentives Substance [place] [address],    [place], [place] [phone]   [facility] [address] [place], [place], [place]   [facility]ally [facility] [address],  [place], [place]   [facility] - Intensive Outpatient [address] Kristeen Mans [address], [place], [place]   [facility] ([place].,  [place], [place] [phone] or [phone]   [facility] Services (RTS) [address], [place], [place] Accepts Medicaid  [facility] [place] [address]  [place] [phone] Substance Abuse/Addiction Treatment   [facility] Organization          Address  Phone  Notes  [facility]  [phone]   Domenic Schwab, PhD [address] Arlis [place], [place]   [phone] or [phone]   [place], [place] [phone]   [facility] [address] [place], [place] [phone] Insurance/Medicaid/sponsorship through [facility] and Families [address], [address] [place]                                    [place], [place] [phone] [place] [address], [place] [phone]    Dr. Adele Schilder  [phone]   [facility] Dept. 1) [address],  2) [place] 3)  [place] [address], [place] [phone]  [phone]   [place] [phone] or [phone] (After Hours)

## 2014-03-19 NOTE — ED Notes (Signed)
Pt sts she was working in [employer] and started having trouble breathing. EMS vitals 138/89 BP, 61HR, RR arrival on scene 30 and enroute 16. Pt sts she is feeling depressed and has thought of wanting to harm herself. Pt sts she thinks she is bipolar because she is happy one second and the going off on her mom the next minute. Pt sts she is in a deep depression and needs help. Pt sts she has lost at least 10lbs last 2 months and states sometimes she eats and sometimes she doesn't eat. House coverage has been called.

## 2014-03-19 NOTE — BH Assessment (Signed)
Tele Assessment Note   Kathy Willis is an 33 y.o. female that presented to [facility] for medical clearance. She was sent to the ED after having a panic attack at work. She has no hx of anxiety/panic attacks. Sts that she has been under a great deal of stress. She lives with a friend along with her 4 children due to loosing her home 1 week ago. She divorced her spouse last year. Additionally, she has not primary supports. Sts that all her family lives in [place] or [place]. She denies suicidal thoughts to this Clinical research associate and contracted for safety. However, ED nursing staff noted that patient had suicidal thoughts upon arrival. Patient denies prior hx of suicidal attempts. No hx of self injurious behaviors. Sts that she is increasingly depressed but more so since last summer around the time of her divorce. Patient is concerned about "mood swings" stating, "I am happy one moment and sad the next. Her depressive symptoms include loss of interest in usual pleasures, irritability, fatigue, and isolating self from others. Sts, "I use to be a outgoing person but now I just sit in the house and try to stay away from socializing with others". Appetite has decreased with 10 pounds of weight loss in the past 2 months. Sleep is poor with 2-3 hrs of sleep per night.   She denies HI. Patient is calm and cooperative. No legal issues. No AVH's.   She does not have any outpatient mental health providers. However, she saw a family therapist in the past with her son. Sts that the therapist told her she was Bipolar at that time but she never went back to therapy or saw a psychiatrist for treatment.  Patient drinks alcohol socially only. She admits to daily [facility] use for the past 2 yrs. She also taken un prescribed Xanax for the past several weeks to "Help mellow me out and keep me calm". She takes 1 pill daily. Patient's last use of both substances was yesterday.   Axis I: Bipolar, Depressed, Anxiety Disorder Nos, and [place] Abuse Axis  II: Deferred Axis III: History reviewed. No pertinent past medical history. Axis IV: other psychosocial or environmental problems, problems related to social environment, problems with access to health care services and problems with primary support group Axis V: 31-40 impairment in reality testing  Past Medical History: History reviewed. No pertinent past medical history.  History reviewed. No pertinent past surgical history.  Family History: History reviewed. No pertinent family history.  Social History:  reports that she has been smoking.  She does not have any smokeless tobacco history on file. She reports that she drinks alcohol. She reports that she uses illicit drugs (Marijuana).  Additional Social History:  Alcohol / Drug Use Pain Medications: SEE MAR Prescriptions: SEE MAR Over the Counter: SEE MAR History of alcohol / drug use?: Yes Substance #1 Name of Substance 1: THC 1 - Age of First Use: 33 yrs old  1 - Amount (size/oz): "Alot"; pt does not specify  1 - Frequency: daily  1 - Duration: on-going  1 - Last Use / Amount: 03/18/2014 Substance #2 Name of Substance 2: Xanax; ("I buy the pills from someone to help mellow me out") 2 - Age of First Use: 33 yrs old  2 - Amount (size/oz): 1 pill at bed time  2 - Frequency: daily for the past several weeks  2 - Duration: past several weeks 2 - Last Use / Amount: last night 03/18/2014  CIWA: CIWA-Ar BP: 140/84  mmHg Pulse Rate: 57 COWS:      Allergies:  Allergies  Allergen Reactions  . Shellfish Allergy Anaphylaxis    Home Medications:  (Not in a hospital admission)  OB/GYN Status:  Patient's last menstrual period was 03/13/2014.  General Assessment Data Location of Assessment: [facility] ED Is this a Tele or Face-to-Face Assessment?: Tele Assessment Is this an Initial Assessment or a Re-assessment for this encounter?: Initial Assessment Living Arrangements: Non-relatives/Friends ("I live with a friend.Marland KitchenMarland KitchenI loss my house last  week") Can pt return to current living arrangement?: Yes Admission Status: Voluntary Is patient capable of signing voluntary admission?: Yes Transfer from: [facility] Referral Source: Self/Family/Friend     [facility] Crisis Care Plan Living Arrangements: Non-relatives/Friends ("I live with a friend.Marland KitchenMarland KitchenI loss my house last week") Name of Psychiatrist:  (No psychiatrist ) Name of Therapist:  (No therapist )  Education Status Is patient currently in school?: No  Risk to self with the past 6 months Suicidal Ideation: No Suicidal Intent: No Is patient at risk for suicide?: No Suicidal Plan?: No Access to Means: No What has been your use of drugs/alcohol within the last 12 months?:  (patient reports THC use ) Previous Attempts/Gestures: No How many times?:  (0) Other Self Harm Risks:  (n/a) Triggers for Past Attempts: Other (Comment) (no previous attempts or gestures) Intentional Self Injurious Behavior: None Family Suicide History: Yes ((Maternal and Paternal) Uncle-Bipolar/Schizophrenia ) Recent stressful life event(s): Other (Comment) (loss of my house last week, divorce last yr,  and "life") Persecutory voices/beliefs?: No Depression: Yes Depression Symptoms: Feeling angry/irritable;Feeling worthless/self pity;Loss of interest in usual pleasures;Guilt;Fatigue;Isolating;Tearfulness;Insomnia;Despondent Substance abuse history and/or treatment for substance abuse?: No Suicide prevention information given to non-admitted patients: Not applicable  Risk to Others within the past 6 months Homicidal Ideation: No Thoughts of Harm to Others: No Current Homicidal Intent: No Current Homicidal Plan: No Access to Homicidal Means: No Identified Victim:  (n/a) History of harm to others?: No Assessment of Violence: None Noted Violent Behavior Description:  (patient is calm and cooperative ) Does patient have access to weapons?: No Criminal Charges Pending?: No Does patient have a court date:  No  Psychosis Hallucinations: None noted Delusions: None noted  Mental Status Report Appear/Hygiene: Disheveled Eye Contact: Good Motor Activity: Freedom of movement Speech: Logical/coherent Level of Consciousness: Alert Mood: Depressed;Anxious;Sad Affect: Depressed;Sad Anxiety Level: Severe Thought Processes: Coherent;Relevant Judgement: Impaired Orientation: Person;Place;Time;Situation Obsessive Compulsive Thoughts/Behaviors: None  Cognitive Functioning Concentration: Decreased Memory: Recent Intact;Remote Intact IQ: Average Appetite: Poor Weight Loss:  (yes- approx. 10 pounds in the past 2 months ) Weight Gain:  (none reported ) Sleep: Decreased Total Hours of Sleep:  ("Maybe 2 or 3 hours at night") Vegetative Symptoms: Staying in bed  ADLScreening [facility] Assessment Services) Patient's cognitive ability adequate to safely complete daily activities?: Yes Patient able to express need for assistance with ADLs?: Yes Independently performs ADLs?: Yes (appropriate for developmental age)  Prior Inpatient Therapy Prior Inpatient Therapy: No ("I have no family here.Marland KitchenMarland KitchenMy family is in [place] and [place]") Prior Therapy Dates:  (n/a) Prior Therapy Facilty/Provider(s):  (n/a) Reason for Treatment:  (family therapy)  Prior Outpatient Therapy Prior Outpatient Therapy: Yes Prior Therapy Dates:  (past-family therapy with son (date unk)) Prior Therapy Facilty/Provider(s):  (patient unable to recall name/location of provider/therapist) Reason for Treatment:  (family therapy with son)  ADL Screening (condition at time of admission) Patient's cognitive ability adequate to safely complete daily activities?: Yes Is the patient deaf or have difficulty hearing?: No Does  the patient have difficulty seeing, even when wearing glasses/contacts?: No Does the patient have difficulty concentrating, remembering, or making decisions?: Yes Patient able to express need for assistance with ADLs?:  Yes Does the patient have difficulty dressing or bathing?: No Independently performs ADLs?: Yes (appropriate for developmental age) Does the patient have difficulty walking or climbing stairs?: No Weakness of Legs: None Weakness of Arms/Hands: None  Home Assistive Devices/Equipment Home Assistive Devices/Equipment: None    Abuse/Neglect Assessment (Assessment to be complete while patient is alone) Physical Abuse: Denies Verbal Abuse: Denies Sexual Abuse: Denies Exploitation of patient/patient's resources: Denies Self-Neglect: Denies Values / Beliefs Cultural Requests During Hospitalization: None Spiritual Requests During Hospitalization: None   Advance Directives (For Healthcare) Advance Directive: Patient does not have advance directive Pre-existing out of facility DNR order (yellow form or pink MOST form): Other (comment) Nutrition Screen- [facility] Adult/WL/AP Patient's home diet: Regular  Additional Information 1:1 In Past 12 Months?: No CIRT Risk: No Elopement Risk: No Does patient have medical clearance?: Yes     Disposition:  Disposition Initial Assessment Completed for this Encounter: Yes Disposition of Patient: Other dispositions (Pending (will run by a provider for recommendations))  Melynda Rippleerry, Shaquel Josephson [facility] 03/19/2014 10:34 AM

## 2014-06-14 ENCOUNTER — Encounter (HOSPITAL_COMMUNITY): Payer: Self-pay | Admitting: Emergency Medicine

## 2014-11-13 ENCOUNTER — Emergency Department (HOSPITAL_COMMUNITY)
Admission: EM | Admit: 2014-11-13 | Discharge: 2014-11-13 | Disposition: A | Discharge status: Home / Self Care | Payer: Self-pay | Attending: Emergency Medicine | Admitting: Emergency Medicine

## 2014-11-13 ENCOUNTER — Emergency Department (HOSPITAL_COMMUNITY): Payer: Self-pay

## 2014-11-13 ENCOUNTER — Encounter (HOSPITAL_COMMUNITY): Payer: Self-pay | Admitting: *Deleted

## 2014-11-13 DIAGNOSIS — Z79899 Other long term (current) drug therapy: Secondary | ICD-10-CM | POA: Insufficient documentation

## 2014-11-13 DIAGNOSIS — K029 Dental caries, unspecified: Secondary | ICD-10-CM | POA: Insufficient documentation

## 2014-11-13 DIAGNOSIS — Z720 Tobacco use: Secondary | ICD-10-CM | POA: Insufficient documentation

## 2014-11-13 DIAGNOSIS — K0889 Other specified disorders of teeth and supporting structures: Secondary | ICD-10-CM

## 2014-11-13 DIAGNOSIS — K088 Other specified disorders of teeth and supporting structures: Secondary | ICD-10-CM | POA: Insufficient documentation

## 2014-11-13 LAB — CBC WITH DIFFERENTIAL/PLATELET
BASOS ABS: 0.1 10*3/uL (ref 0.0–0.1)
BASOS PCT: 1 % (ref 0–1)
EOS ABS: 0.5 10*3/uL (ref 0.0–0.7)
EOS PCT: 5 % (ref 0–5)
HCT: 36.6 % (ref 36.0–46.0)
Hemoglobin: 12.1 g/dL (ref 12.0–15.0)
LYMPHS ABS: 2.1 10*3/uL (ref 0.7–4.0)
Lymphocytes Relative: 21 % (ref 12–46)
MCH: 27.9 pg (ref 26.0–34.0)
MCHC: 33.1 g/dL (ref 30.0–36.0)
MCV: 84.3 fL (ref 78.0–100.0)
MONO ABS: 0.6 10*3/uL (ref 0.1–1.0)
Monocytes Relative: 6 % (ref 3–12)
Neutro Abs: 6.9 10*3/uL (ref 1.7–7.7)
Neutrophils Relative %: 67 % (ref 43–77)
PLATELETS: 419 10*3/uL — AB (ref 150–400)
RBC: 4.34 MIL/uL (ref 3.87–5.11)
RDW: 15.4 % (ref 11.5–15.5)
WBC: 10.2 10*3/uL (ref 4.0–10.5)

## 2014-11-13 LAB — BASIC METABOLIC PANEL
Anion gap: 11 (ref 5–15)
BUN: 5 mg/dL — ABNORMAL LOW (ref 6–23)
CO2: 21 mmol/L (ref 19–32)
Calcium: 9.1 mg/dL (ref 8.4–10.5)
Chloride: 103 mmol/L (ref 96–112)
Creatinine, Ser: 0.63 mg/dL (ref 0.50–1.10)
GFR calc Af Amer: >90 mL/min (ref >90)
Glucose, Bld: 85 mg/dL (ref 70–99)
Potassium: 4 mmol/L (ref 3.5–5.1)
Sodium: 135 mmol/L (ref 135–145)

## 2014-11-13 MED ORDER — PENICILLIN V POTASSIUM 500 MG PO TABS: 500.0000 mg | ORAL_TABLET | Freq: Four times a day (QID) | ORAL | Status: AC

## 2014-11-13 MED ORDER — IOHEXOL 300 MG/ML  SOLN
75.0000 mL | Freq: Once | INTRAMUSCULAR | Status: AC | PRN
  Administered 2014-11-13: 75 mL via INTRAVENOUS

## 2014-11-13 MED ORDER — OXYCODONE-ACETAMINOPHEN 5-325 MG PO TABS
2.0000 | ORAL_TABLET | Freq: Once | ORAL | Status: AC
  Administered 2014-11-13: 2 via ORAL
  Filled 2014-11-13: qty 2

## 2014-11-13 MED ORDER — IBUPROFEN 800 MG PO TABS: 800.0000 mg | ORAL_TABLET | Freq: Three times a day (TID) | ORAL | Status: DC

## 2014-11-13 MED ORDER — FENTANYL CITRATE 0.05 MG/ML IJ SOLN
50.0000 ug | Freq: Once | INTRAMUSCULAR | Status: AC
  Administered 2014-11-13: 50 ug via INTRAVENOUS
  Filled 2014-11-13: qty 2

## 2014-11-13 NOTE — ED Provider Notes (Signed)
CSN: [id number]     Arrival date & time 11/13/14  1219 History   First MD Initiated Contact with Patient 11/13/14 1229     Chief Complaint  Patient presents with  . Dental Problem     (Consider location/radiation/quality/duration/timing/severity/associated sxs/prior Treatment) HPI Comments: Patient presents today with a chief complaint of right lower molar tooth pain.  She reports that pieces of this tooth has been breaking off for months.  However, tooth just became painful 3 days ago and is gradually worsening.  She states that yesterday she felt as if the right side of her neck was swollen and that she was unable to fully open her mouth.  She also reports that she began having some difficulty swallowing.  She has taken Advil, Aleve, Motrin, and Percocet 10 mg for her pain without improvement.  She denies fever, chills, nausea, or vomiting.  She reports that she does not have a dentist.  The history is provided by the patient.    History reviewed. No pertinent past medical history. History reviewed. No pertinent past surgical history. History reviewed. No pertinent family history. History  Substance Use Topics  . Smoking status: Current Every Day Smoker -- 0.50 packs/day  . Smokeless tobacco: Not on file  . Alcohol Use: Yes     Comment: occasion   OB History    Gravida Para Term Preterm AB TAB SAB Ectopic Multiple Living   Review of Systems  All other systems reviewed and are negative.     Allergies  Shellfish allergy  Home Medications   Prior to Admission medications   Medication Sig Start Date End Date Taking? Authorizing Provider  ibuprofen (ADVIL,MOTRIN) 200 MG tablet Take 200 mg by mouth every 6 (six) hours as needed for headache or moderate pain.    Historical Provider, MD  LORazepam (ATIVAN) 1 MG tablet Take 1 tablet (1 mg total) by mouth 3 (three) times daily as needed for anxiety. 03/19/14   Kristen N Ward, DO   BP 115/72 mmHg  Pulse 82   Temp(Src) 98.1 F (36.7 C) (Oral)  Resp 18  SpO2 100% Physical Exam  Constitutional: She appears well-developed and well-nourished.  HENT:  Head: Normocephalic and atraumatic.  Mouth/Throat: Oropharynx is clear and moist. There is trismus in the jaw. Dental caries present. No uvula swelling.  Tenderness to palpation of the right lower gingiva.  No fluctuance.  No sublingual tenderness or swelling.  Neck: Normal range of motion. Neck supple.  No obvious edema of the neck visualized or palpated  Cardiovascular: Normal rate, regular rhythm and normal heart sounds.   Pulmonary/Chest: Effort normal and breath sounds normal.  Neurological: She is alert.  Skin: Skin is warm and dry.  Psychiatric: She has a normal mood and affect.  Nursing note and vitals reviewed.   ED Course  Procedures (including critical care time) Labs Review Labs Reviewed - No data to display  Imaging Review Ct Soft Tissue Neck W Contrast  11/13/2014   CLINICAL DATA:  Dysphagia, recent broken tooth  EXAM: CT NECK WITH CONTRAST  TECHNIQUE: Multidetector CT imaging of the neck was performed using the standard protocol following the bolus administration of intravenous contrast.  CONTRAST:  75mL OMNIPAQUE IOHEXOL 300 MG/ML  SOLN  COMPARISON:  No recent similar comparison exam  FINDINGS: Airway is patent and midline.  Lung apices are clear.  Great vessels are normal in caliber. Thyroid is unremarkable. Orbits  and paranasal sinuses are clear in their visualized aspects. Mild streak artifact from dental amalgam. Dental caries are noted. No acute fracture identified involving the skeleton. No lymphadenopathy.  No radiopaque foreign body identified.  IMPRESSION: Normal exam.   Electronically Signed   By: Christiana PellantGretchen  Green M.D.   On: 11/13/2014 14:39     EKG Interpretation None      MDM   Final diagnoses:  None   Patient presents today with right lower dental pain x 3 days.   She also reports that she feels like the right  side of her neck is swollen and does have some trismus on exam.  No obvious swelling of her neck visualized.  No sublingual tenderness or swelling.  She is afebrile and non toxic appearing.  Labs unremarkable.  CT soft tissue neck is negative.  Patient able to drink liquids.  Feel that the patient is stable for discharge.  Patient given referral to dentist.  Return precautions given.    Santiago GladHeather Kahleah Crass, PA-C 11/13/14 1524  Santiago GladHeather Ramaj Frangos, PA-C 11/13/14 1525  Mirian MoMatthew Gentry, MD 11/16/14 [phone]

## 2014-11-13 NOTE — ED Notes (Signed)
Pt reports having severe right lower dental pain for several days, now feels like she has swelling in right side of neck. Airway intact. Reports unable to eat or drink.

## 2014-11-15 ENCOUNTER — Encounter (HOSPITAL_COMMUNITY): Payer: Self-pay | Admitting: Emergency Medicine

## 2014-11-15 DIAGNOSIS — Z720 Tobacco use: Secondary | ICD-10-CM | POA: Insufficient documentation

## 2014-11-15 DIAGNOSIS — K047 Periapical abscess without sinus: Secondary | ICD-10-CM | POA: Insufficient documentation

## 2014-11-15 DIAGNOSIS — Z792 Long term (current) use of antibiotics: Secondary | ICD-10-CM | POA: Insufficient documentation

## 2014-11-15 NOTE — ED Notes (Signed)
The patient she was here saturday and she was given antibiotics for a tooth infection.  She is here now because she has pus coming out of the tooth.  She says her pain is 7/10.  She is still taking her medications as prescribed but was concerned because she has the pus draining.

## 2014-11-16 ENCOUNTER — Emergency Department (HOSPITAL_COMMUNITY)
Admission: EM | Admit: 2014-11-16 | Discharge: 2014-11-16 | Disposition: A | Discharge status: Home / Self Care | Payer: Self-pay | Attending: Emergency Medicine | Admitting: Emergency Medicine

## 2014-11-16 DIAGNOSIS — K047 Periapical abscess without sinus: Secondary | ICD-10-CM

## 2014-11-16 MED ORDER — OXYCODONE-ACETAMINOPHEN 5-325 MG PO TABS
1.0000 | ORAL_TABLET | Freq: Once | ORAL | Status: AC
  Administered 2014-11-16: 1 via ORAL
  Filled 2014-11-16: qty 1

## 2014-11-16 MED ORDER — TRAMADOL HCL 50 MG PO TABS: 50.0000 mg | ORAL_TABLET | Freq: Four times a day (QID) | ORAL | Status: DC | PRN

## 2014-11-16 NOTE — ED Notes (Signed)
Patient stated understanding to follow up with referred dentist and teach back method used to insure patient understood discharge instructions. Prescriptions reviewed.

## 2014-11-16 NOTE — Discharge Instructions (Signed)
Continue taking her antibiotic. Call the on-call dentist for an appointment as soon as possible.   Dental Abscess A dental abscess is a collection of infected fluid (pus) from a bacterial infection in the inner part of the tooth (pulp). It usually occurs at the end of the tooth's root.  CAUSES   Severe tooth decay.  Trauma to the tooth that allows bacteria to enter into the pulp, such as a broken or chipped tooth. SYMPTOMS   Severe pain in and around the infected tooth.  Swelling and redness around the abscessed tooth or in the mouth or face.  Tenderness.  Pus drainage.  Bad breath.  Bitter taste in the mouth.  Difficulty swallowing.  Difficulty opening the mouth.  Nausea.  Vomiting.  Chills.  Swollen neck glands. DIAGNOSIS   A medical and dental history will be taken.  An examination will be performed by tapping on the abscessed tooth.  X-rays may be taken of the tooth to identify the abscess. TREATMENT The goal of treatment is to eliminate the infection. You may be prescribed antibiotic medicine to stop the infection from spreading. A root canal may be performed to save the tooth. If the tooth cannot be saved, it may be pulled (extracted) and the abscess may be drained.  HOME CARE INSTRUCTIONS  Only take over-the-counter or prescription medicines for pain, fever, or discomfort as directed by your caregiver.  Rinse your mouth (gargle) often with salt water ( tsp salt in 8 oz [250 ml] of warm water) to relieve pain or swelling.  Do not drive after taking pain medicine (narcotics).  Do not apply heat to the outside of your face.  Return to your dentist for further treatment as directed. SEEK MEDICAL CARE IF:  Your pain is not helped by medicine.  Your pain is getting worse instead of better. SEEK IMMEDIATE MEDICAL CARE IF:  You have a fever or persistent symptoms for more than 2-3 days.  You have a fever and your symptoms suddenly get worse.  You have  chills or a very bad headache.  You have problems breathing or swallowing.  You have trouble opening your mouth.  You have swelling in the neck or around the eye. Document Released: 07/30/2005 Document Revised: 04/23/2012 Document Reviewed: 11/07/2010 [facility] Patient Information 2015 [place], [place]. This information is not intended to replace advice given to you by your health care provider. Make sure you discuss any questions you have with your health care provider.  Tramadol tablets What is this medicine? TRAMADOL (TRA ma dole) is a pain reliever. It is used to treat moderate to severe pain in adults. This medicine may be used for other purposes; ask your health care provider or pharmacist if you have questions. COMMON BRAND NAME(S): Ultram What should I tell my health care provider before I take this medicine? They need to know if you have any of these conditions: -brain tumor -depression -drug abuse or addiction -head injury -if you frequently drink alcohol containing drinks -kidney disease or trouble passing urine -liver disease -lung disease, asthma, or breathing problems -seizures or epilepsy -suicidal thoughts, plans, or attempt; a previous suicide attempt by you or a family member -an unusual or allergic reaction to tramadol, codeine, other medicines, foods, dyes, or preservatives -pregnant or trying to get pregnant -breast-feeding How should I use this medicine? Take this medicine by mouth with a full glass of water. Follow the directions on the prescription label. If the medicine upsets your stomach, take it with food  or milk. Do not take more medicine than you are told to take. Talk to your pediatrician regarding the use of this medicine in children. Special care may be needed. Overdosage: If you think you have taken too much of this medicine contact a poison control center or emergency room at once. NOTE: This medicine is only for you. Do not share this medicine with  others. What if I miss a dose? If you miss a dose, take it as soon as you can. If it is almost time for your next dose, take only that dose. Do not take double or extra doses. What may interact with this medicine? Do not take this medicine with any of the following medications: -MAOIs like Carbex, Eldepryl, Marplan, Nardil, and Parnate This medicine may also interact with the following medications: -alcohol or medicines that contain alcohol -antihistamines -benzodiazepines -bupropion -carbamazepine or oxcarbazepine -clozapine -cyclobenzaprine -digoxin -furazolidone -linezolid -medicines for depression, anxiety, or psychotic disturbances -medicines for migraine headache like almotriptan, eletriptan, frovatriptan, naratriptan, rizatriptan, sumatriptan, zolmitriptan -medicines for pain like pentazocine, buprenorphine, butorphanol, meperidine, nalbuphine, and propoxyphene -medicines for sleep -muscle relaxants -naltrexone -phenobarbital -phenothiazines like perphenazine, thioridazine, chlorpromazine, mesoridazine, fluphenazine, prochlorperazine, promazine, and trifluoperazine -procarbazine -warfarin This list may not describe all possible interactions. Give your health care provider a list of all the medicines, herbs, non-prescription drugs, or dietary supplements you use. Also tell them if you smoke, drink alcohol, or use illegal drugs. Some items may interact with your medicine. What should I watch for while using this medicine? Tell your doctor or health care professional if your pain does not go away, if it gets worse, or if you have new or a different type of pain. You may develop tolerance to the medicine. Tolerance means that you will need a higher dose of the medicine for pain relief. Tolerance is normal and is expected if you take this medicine for a long time. Do not suddenly stop taking your medicine because you may develop a severe reaction. Your body becomes used to the  medicine. This does NOT mean you are addicted. Addiction is a behavior related to getting and using a drug for a non-medical reason. If you have pain, you have a medical reason to take pain medicine. Your doctor will tell you how much medicine to take. If your doctor wants you to stop the medicine, the dose will be slowly lowered over time to avoid any side effects. You may get drowsy or dizzy. Do not drive, use machinery, or do anything that needs mental alertness until you know how this medicine affects you. Do not stand or sit up quickly, especially if you are an older patient. This reduces the risk of dizzy or fainting spells. Alcohol can increase or decrease the effects of this medicine. Avoid alcoholic drinks. You may have constipation. Try to have a bowel movement at least every 2 to 3 days. If you do not have a bowel movement for 3 days, call your doctor or health care professional. Your mouth may get dry. Chewing sugarless gum or sucking hard candy, and drinking plenty of water may help. Contact your doctor if the problem does not go away or is severe. What side effects may I notice from receiving this medicine? Side effects that you should report to your doctor or health care professional as soon as possible: -allergic reactions like skin rash, itching or hives, swelling of the face, lips, or tongue -breathing difficulties, wheezing -confusion -itching -light headedness or fainting spells -redness, blistering, peeling  or loosening of the skin, including inside the mouth -seizures Side effects that usually do not require medical attention (report to your doctor or health care professional if they continue or are bothersome): -constipation -dizziness -drowsiness -headache -nausea, vomiting This list may not describe all possible side effects. Call your doctor for medical advice about side effects. You may report side effects to FDA at 1-800-FDA-1088. Where should I keep my medicine? Keep  out of the reach of children. Store at room temperature between 15 and 30 degrees C (59 and 86 degrees F). Keep container tightly closed. Throw away any unused medicine after the expiration date. NOTE: This sheet is a summary. It may not cover all possible information. If you have questions about this medicine, talk to your doctor, pharmacist, or health care provider.  2015, [employer]vier/[employer] Standard. (2010-04-12 11:55:44)

## 2014-11-16 NOTE — ED Provider Notes (Signed)
CSN: [id number]     Arrival date & time 11/15/14  2326 History   First MD Initiated Contact with Patient 11/16/14 0408     Chief Complaint  Patient presents with  . Wound Check    The patient she was here saturday and she was given antibiotics for a tooth infection.  She is here now because she has pus coming out of the tooth.       (Consider location/radiation/quality/duration/timing/severity/associated sxs/prior Treatment) Patient is a 34 y.o. female presenting with wound check. The history is provided by the patient.  Wound Check  She was seen here several days ago for dental pain and had discharged with prescription for penicillin and referred to on-call dentist. She has not made an appointment with a dentist, but noticed a pimple, over the affected tooth and when she just with her tongue, it started draining a lot of pus. This is a right lower molar which was causing her pain. Pain did improve once the pus drained from around the tooth. She was worried that she might get sick from having swallowed some pus.  History reviewed. No pertinent past medical history. History reviewed. No pertinent past surgical history. History reviewed. No pertinent family history. History  Substance Use Topics  . Smoking status: Current Every Day Smoker -- 0.50 packs/day  . Smokeless tobacco: Not on file  . Alcohol Use: Yes     Comment: occasion   OB History    Gravida Para Term Preterm AB TAB SAB Ectopic Multiple Living   9 4 4  4 4    4      Review of Systems  All other systems reviewed and are negative.     Allergies  Shellfish allergy  Home Medications   Prior to Admission medications   Medication Sig Start Date End Date Taking? Authorizing Provider  ibuprofen (ADVIL,MOTRIN) 800 MG tablet Take 1 tablet (800 mg total) by mouth 3 (three) times daily. 11/13/14  Yes Heather Laisure, PA-C  penicillin v potassium (VEETID) 500 MG tablet Take 1 tablet (500 mg total) by mouth 4 (four) times daily.  11/13/14 11/20/14 Yes Heather Laisure, PA-C   BP 111/68 mmHg  Pulse 55  Temp(Src) 98.2 F (36.8 C) (Oral)  Resp 16  SpO2 100%  LMP 10/22/2014 Physical Exam  Nursing note and vitals reviewed.  34 year old female, resting comfortably and in no acute distress. Vital signs are significant for bradycardia. Oxygen saturation is 100%, which is normal. Head is normocephalic and atraumatic. PERRLA, EOMI. Oropharynx is clear. Tooth #30 has caries present and some gingival swelling and marked gingival tenderness. Tooth #32 has been previously extracted. Neck is nontender and supple without adenopathy or JVD. Back is nontender and there is no CVA tenderness. Lungs are clear without rales, wheezes, or rhonchi. Chest is nontender. Heart has regular rate and rhythm without murmur. Abdomen is soft, flat, nontender without masses or hepatosplenomegaly and peristalsis is normoactive. Extremities have no cyanosis or edema, full range of motion is present. Skin is warm and dry without rash. Neurologic: Mental status is normal, cranial nerves are intact, there are no motor or sensory deficits.  ED Course  Procedures (including critical care time)  MDM   Final diagnoses:  Dental abscess    Dental abscess with spontaneous drainage. Old records reviewed confirming recent ED visit and she had a CT scan at that point which showed no evidence of any deep infections. Patient is advised that she needs to follow-up as soon as possible  with the on-call dentist. She is to continue taking her penicillin and is given a prescription for tramadol for pain.    Dione Booze, MD 11/16/14 [phone]

## 2014-11-16 NOTE — ED Notes (Signed)
Patient presents for a recheck of the jaw swelling she was seen for on Saturday. She states that she noticed draining under her tongue on the right side and a small open area. This concerned her because she has not had any relief from the ABT yet. She states the drainage has provided some mild relief and this is the first relief she has felt.

## 2015-11-05 ENCOUNTER — Encounter (HOSPITAL_COMMUNITY)
Admission: EM | Disposition: A | Discharge status: Home / Self Care | Payer: Self-pay | Source: Home / Self Care | Attending: Emergency Medicine

## 2015-11-05 ENCOUNTER — Emergency Department (HOSPITAL_COMMUNITY): Payer: Self-pay

## 2015-11-05 ENCOUNTER — Emergency Department (HOSPITAL_COMMUNITY): Payer: Self-pay | Admitting: Anesthesiology

## 2015-11-05 ENCOUNTER — Ambulatory Visit (HOSPITAL_COMMUNITY)
Admission: EM | Admit: 2015-11-05 | Discharge: 2015-11-05 | Disposition: A | Discharge status: Home / Self Care | Payer: Self-pay | Attending: Emergency Medicine | Admitting: Emergency Medicine

## 2015-11-05 ENCOUNTER — Encounter (HOSPITAL_COMMUNITY): Payer: Self-pay

## 2015-11-05 DIAGNOSIS — W260XXA Contact with knife, initial encounter: Secondary | ICD-10-CM | POA: Insufficient documentation

## 2015-11-05 DIAGNOSIS — F17200 Nicotine dependence, unspecified, uncomplicated: Secondary | ICD-10-CM | POA: Insufficient documentation

## 2015-11-05 DIAGNOSIS — S64496A Injury of digital nerve of right little finger, initial encounter: Secondary | ICD-10-CM | POA: Insufficient documentation

## 2015-11-05 DIAGNOSIS — S66126A Laceration of flexor muscle, fascia and tendon of right little finger at wrist and hand level, initial encounter: Secondary | ICD-10-CM | POA: Insufficient documentation

## 2015-11-05 DIAGNOSIS — S61219A Laceration without foreign body of unspecified finger without damage to nail, initial encounter: Secondary | ICD-10-CM

## 2015-11-05 HISTORY — PX: TENDON REPAIR: SHX5111

## 2015-11-05 SURGERY — TENDON REPAIR
Anesthesia: Regional | Site: Finger | Laterality: Right

## 2015-11-05 MED ORDER — LIDOCAINE HCL (PF) 1 % IJ SOLN
10.0000 mL | Freq: Once | INTRAMUSCULAR | Status: AC
  Administered 2015-11-05: 10 mL
  Filled 2015-11-05: qty 10

## 2015-11-05 MED ORDER — OXYCODONE HCL 5 MG PO TABS: 5.0000 mg | ORAL_TABLET | ORAL | Status: DC | PRN

## 2015-11-05 MED ORDER — BUPIVACAINE HCL (PF) 0.25 % IJ SOLN
INTRAMUSCULAR | Status: AC
  Filled 2015-11-05: qty 30

## 2015-11-05 MED ORDER — 0.9 % SODIUM CHLORIDE (POUR BTL) OPTIME
TOPICAL | Status: DC | PRN
  Administered 2015-11-05: 1000 mL

## 2015-11-05 MED ORDER — DEXAMETHASONE SODIUM PHOSPHATE 4 MG/ML IJ SOLN
INTRAMUSCULAR | Status: DC | PRN
  Administered 2015-11-05: 4 mg via INTRAVENOUS

## 2015-11-05 MED ORDER — BUPIVACAINE-EPINEPHRINE (PF) 0.5% -1:200000 IJ SOLN
INTRAMUSCULAR | Status: DC | PRN
  Administered 2015-11-05: 20 mL via PERINEURAL

## 2015-11-05 MED ORDER — LACTATED RINGERS IV SOLN
INTRAVENOUS | Status: DC
  Administered 2015-11-05 (×3): via INTRAVENOUS

## 2015-11-05 MED ORDER — MIDAZOLAM HCL 2 MG/2ML IJ SOLN
INTRAMUSCULAR | Status: AC
  Filled 2015-11-05: qty 2

## 2015-11-05 MED ORDER — OXYCODONE HCL 5 MG/5ML PO SOLN: 5.0000 mg | Freq: Once | ORAL | Status: DC | PRN

## 2015-11-05 MED ORDER — PROPOFOL 10 MG/ML IV BOLUS
INTRAVENOUS | Status: DC | PRN
Start: 2015-11-05 — End: 2015-11-05
  Administered 2015-11-05: 200 mg via INTRAVENOUS

## 2015-11-05 MED ORDER — PROMETHAZINE HCL 25 MG/ML IJ SOLN: 6.2500 mg | INTRAMUSCULAR | Status: DC | PRN

## 2015-11-05 MED ORDER — FENTANYL CITRATE (PF) 100 MCG/2ML IJ SOLN
INTRAMUSCULAR | Status: AC
  Administered 2015-11-05: 100 ug via INTRAVENOUS
  Filled 2015-11-05: qty 2

## 2015-11-05 MED ORDER — CEPHALEXIN 500 MG PO CAPS: 500.0000 mg | ORAL_CAPSULE | Freq: Four times a day (QID) | ORAL | Status: DC

## 2015-11-05 MED ORDER — ONDANSETRON HCL 4 MG/2ML IJ SOLN
INTRAMUSCULAR | Status: AC
  Administered 2015-11-05: 4 mg via INTRAVENOUS
  Filled 2015-11-05: qty 2

## 2015-11-05 MED ORDER — OXYCODONE-ACETAMINOPHEN 5-325 MG PO TABS
1.0000 | ORAL_TABLET | Freq: Once | ORAL | Status: AC
  Administered 2015-11-05: 1 via ORAL
  Filled 2015-11-05: qty 1

## 2015-11-05 MED ORDER — CEFAZOLIN SODIUM-DEXTROSE 2-4 GM/100ML-% IV SOLN
INTRAVENOUS | Status: AC
  Filled 2015-11-05: qty 100

## 2015-11-05 MED ORDER — ONDANSETRON HCL 4 MG/2ML IJ SOLN
4.0000 mg | Freq: Once | INTRAMUSCULAR | Status: AC
  Administered 2015-11-05: 4 mg via INTRAVENOUS

## 2015-11-05 MED ORDER — POVIDONE-IODINE 10 % EX SWAB: 2.0000 "application " | Freq: Once | CUTANEOUS | Status: DC

## 2015-11-05 MED ORDER — CEFAZOLIN SODIUM-DEXTROSE 2-4 GM/100ML-% IV SOLN
2.0000 g | INTRAVENOUS | Status: AC
  Administered 2015-11-05: 2 g via INTRAVENOUS

## 2015-11-05 MED ORDER — FENTANYL CITRATE (PF) 250 MCG/5ML IJ SOLN
INTRAMUSCULAR | Status: AC
  Filled 2015-11-05: qty 5

## 2015-11-05 MED ORDER — TETANUS-DIPHTH-ACELL PERTUSSIS 5-2.5-18.5 LF-MCG/0.5 IM SUSP
0.5000 mL | Freq: Once | INTRAMUSCULAR | Status: AC
  Administered 2015-11-05: 0.5 mL via INTRAMUSCULAR
  Filled 2015-11-05: qty 0.5

## 2015-11-05 MED ORDER — MIDAZOLAM HCL 2 MG/2ML IJ SOLN
2.0000 mg | Freq: Once | INTRAMUSCULAR | Status: AC
  Administered 2015-11-05: 2 mg via INTRAVENOUS

## 2015-11-05 MED ORDER — FENTANYL CITRATE (PF) 100 MCG/2ML IJ SOLN
INTRAMUSCULAR | Status: DC | PRN
  Administered 2015-11-05 (×5): 25 ug via INTRAVENOUS

## 2015-11-05 MED ORDER — MIDAZOLAM HCL 2 MG/2ML IJ SOLN
INTRAMUSCULAR | Status: AC
  Administered 2015-11-05: 2 mg via INTRAVENOUS
  Filled 2015-11-05: qty 2

## 2015-11-05 MED ORDER — PROPOFOL 10 MG/ML IV BOLUS
INTRAVENOUS | Status: AC
  Filled 2015-11-05: qty 20

## 2015-11-05 MED ORDER — ONDANSETRON HCL 4 MG/2ML IJ SOLN
4.0000 mg | Freq: Once | INTRAMUSCULAR | Status: AC
  Administered 2015-11-05: 4 mg via INTRAVENOUS
  Filled 2015-11-05: qty 2

## 2015-11-05 MED ORDER — LIDOCAINE-EPINEPHRINE 1 %-1:100000 IJ SOLN
10.0000 mL | Freq: Once | INTRAMUSCULAR | Status: DC
  Filled 2015-11-05: qty 1

## 2015-11-05 MED ORDER — CHLORHEXIDINE GLUCONATE 4 % EX LIQD: 60.0000 mL | Freq: Once | CUTANEOUS | Status: DC

## 2015-11-05 MED ORDER — FENTANYL CITRATE (PF) 100 MCG/2ML IJ SOLN: 25.0000 ug | INTRAMUSCULAR | Status: DC | PRN

## 2015-11-05 MED ORDER — FENTANYL CITRATE (PF) 100 MCG/2ML IJ SOLN
100.0000 ug | Freq: Once | INTRAMUSCULAR | Status: AC
  Administered 2015-11-05: 100 ug via INTRAVENOUS

## 2015-11-05 MED ORDER — LIDOCAINE HCL (CARDIAC) 20 MG/ML IV SOLN
INTRAVENOUS | Status: AC
  Filled 2015-11-05: qty 5

## 2015-11-05 MED ORDER — OXYCODONE HCL 5 MG PO TABS: 5.0000 mg | ORAL_TABLET | Freq: Once | ORAL | Status: DC | PRN

## 2015-11-05 MED ORDER — LIDOCAINE HCL (PF) 1 % IJ SOLN: 10.0000 mL | Freq: Once | INTRAMUSCULAR | Status: DC

## 2015-11-05 SURGICAL SUPPLY — 59 items
BANDAGE ACE 3X5.8 VEL STRL LF (GAUZE/BANDAGES/DRESSINGS) ×3 IMPLANT
BANDAGE ELASTIC 3 VELCRO ST LF (GAUZE/BANDAGES/DRESSINGS) IMPLANT
BANDAGE ELASTIC 4 VELCRO ST LF (GAUZE/BANDAGES/DRESSINGS) ×3 IMPLANT
BNDG COHESIVE 1X5 TAN STRL LF (GAUZE/BANDAGES/DRESSINGS) IMPLANT
BNDG CONFORM 3 STRL LF (GAUZE/BANDAGES/DRESSINGS) ×3 IMPLANT
BNDG GAUZE ELAST 4 BULKY (GAUZE/BANDAGES/DRESSINGS) ×3 IMPLANT
CORDS BIPOLAR (ELECTRODE) ×3 IMPLANT
COVER SURGICAL LIGHT HANDLE (MISCELLANEOUS) ×3 IMPLANT
CUFF TOURNIQUET SINGLE 18IN (TOURNIQUET CUFF) ×3 IMPLANT
CUFF TOURNIQUET SINGLE 24IN (TOURNIQUET CUFF) IMPLANT
DECANTER SPIKE VIAL GLASS SM (MISCELLANEOUS) ×3 IMPLANT
DRAPE SURG 17X23 STRL (DRAPES) ×3 IMPLANT
DRSG EMULSION OIL 3X3 NADH (GAUZE/BANDAGES/DRESSINGS) ×3 IMPLANT
GAUZE SPONGE 2X2 8PLY STRL LF (GAUZE/BANDAGES/DRESSINGS) IMPLANT
GAUZE SPONGE 4X4 12PLY STRL (GAUZE/BANDAGES/DRESSINGS) ×3 IMPLANT
GAUZE XEROFORM 1X8 LF (GAUZE/BANDAGES/DRESSINGS) ×3 IMPLANT
GLOVE BIOGEL M STRL SZ7.5 (GLOVE) ×3 IMPLANT
GLOVE SS BIOGEL STRL SZ 8 (GLOVE) ×1 IMPLANT
GLOVE SUPERSENSE BIOGEL SZ 8 (GLOVE) ×2
GOWN STRL REUS W/ TWL LRG LVL3 (GOWN DISPOSABLE) ×2 IMPLANT
GOWN STRL REUS W/ TWL XL LVL3 (GOWN DISPOSABLE) ×3 IMPLANT
GOWN STRL REUS W/TWL LRG LVL3 (GOWN DISPOSABLE) ×4
GOWN STRL REUS W/TWL XL LVL3 (GOWN DISPOSABLE) ×6
KIT BASIN OR (CUSTOM PROCEDURE TRAY) ×3 IMPLANT
KIT ROOM TURNOVER OR (KITS) ×3 IMPLANT
MANIFOLD NEPTUNE II (INSTRUMENTS) ×3 IMPLANT
NEEDLE HYPO 25GX1X1/2 BEV (NEEDLE) ×3 IMPLANT
NS IRRIG 1000ML POUR BTL (IV SOLUTION) ×3 IMPLANT
PACK ORTHO EXTREMITY (CUSTOM PROCEDURE TRAY) ×3 IMPLANT
PAD ARMBOARD 7.5X6 YLW CONV (MISCELLANEOUS) ×6 IMPLANT
PAD CAST 4YDX4 CTTN HI CHSV (CAST SUPPLIES) IMPLANT
PADDING CAST ABS 4INX4YD NS (CAST SUPPLIES) ×2
PADDING CAST ABS COTTON 4X4 ST (CAST SUPPLIES) ×1 IMPLANT
PADDING CAST COTTON 4X4 STRL (CAST SUPPLIES)
PASSER SUT SWANSON 36MM LOOP (INSTRUMENTS) ×3 IMPLANT
SCRUB FOAM CHG 2% SURGICAL (MISCELLANEOUS) ×3 IMPLANT
SOLUTION BETADINE 4OZ (MISCELLANEOUS) IMPLANT
SPECIMEN JAR SMALL (MISCELLANEOUS) ×3 IMPLANT
SPLINT FIBERGLASS 3X12 (CAST SUPPLIES) ×3 IMPLANT
SPONGE GAUZE 2X2 STER 10/PKG (GAUZE/BANDAGES/DRESSINGS)
SPONGE SCRUB IODOPHOR (GAUZE/BANDAGES/DRESSINGS) IMPLANT
SUCTION FRAZIER HANDLE 10FR (MISCELLANEOUS) ×2
SUCTION TUBE FRAZIER 10FR DISP (MISCELLANEOUS) ×1 IMPLANT
SUT ETHILON 10 0 V75 3 (SUTURE) ×3 IMPLANT
SUT FIBERWIRE 4-0 18 TAPR NDL (SUTURE) ×9
SUT MERSILENE 4 0 P 3 (SUTURE) IMPLANT
SUT PROLENE 4 0 PS 2 18 (SUTURE) ×6 IMPLANT
SUT PROLENE 5 0 PS 2 (SUTURE) ×6 IMPLANT
SUT PROLENE 7 0 P 1 (SUTURE) ×3 IMPLANT
SUT VIC AB 2-0 CT1 27 (SUTURE)
SUT VIC AB 2-0 CT1 TAPERPNT 27 (SUTURE) IMPLANT
SUTURE FIBERWR 4-0 18 TAPR NDL (SUTURE) ×3 IMPLANT
SYR CONTROL 10ML LL (SYRINGE) IMPLANT
TOWEL OR 17X24 6PK STRL BLUE (TOWEL DISPOSABLE) ×3 IMPLANT
TOWEL OR 17X26 10 PK STRL BLUE (TOWEL DISPOSABLE) ×3 IMPLANT
TUBE CONNECTING 12'X1/4 (SUCTIONS) ×1
TUBE CONNECTING 12X1/4 (SUCTIONS) ×2 IMPLANT
UNDERPAD 30X30 INCONTINENT (UNDERPADS AND DIAPERS) ×6 IMPLANT
WATER STERILE IRR 1000ML POUR (IV SOLUTION) ×3 IMPLANT

## 2015-11-05 NOTE — ED Notes (Signed)
Pt states that she "had a few drinks and then I was cutting an apple, and I reached into the dryer rack and I cut finger on a knife" Pt states that pain is "20 out of 10 and it feels like someone is pouring hot oil over it" Pt has a laceration about 1 cm long on her pinkie finger on her right hand. Pt is unable to bend her finger without great difficulty. Sensation and capillary refill is present distal the injury.

## 2015-11-05 NOTE — Progress Notes (Addendum)
Patient reported to the ER that she has areola rings in that do not come out. Notified Dr. Gentry RochJudd will speak further to patient about risk with cautery per Dr. Gentry RochJudd. Patient reports that she has not had rings out in six years. Explained to patient that she was at an increased risk of burns by leaving rings in. Patient attempting to take rings out.

## 2015-11-05 NOTE — Transfer of Care (Signed)
Immediate Anesthesia Transfer of Care Note  Patient: Kathy Willis  Procedure(s) Performed: Procedure(s): TENDON REPAIR RIGHT SMALL FINGER (Right)  Patient Location: PACU  Anesthesia Type:GA combined with regional for post-op pain  Level of Consciousness: awake and alert   Airway & Oxygen Therapy: Patient Spontanous Breathing and Patient connected to nasal cannula oxygen  Post-op Assessment: Report given to RN and Post -op Vital signs reviewed and stable  Post vital signs: Reviewed and stable  Last Vitals:  Filed Vitals:   11/05/15 1051 11/05/15 1245  BP:  113/84  Pulse: 98 118  Temp:    Resp:  14    Complications: No apparent anesthesia complications

## 2015-11-05 NOTE — Progress Notes (Signed)
Orthopedic Tech Progress Note Patient Details:  Kathy Willis 08/08/1981 [id number]  Ortho Devices Type of Ortho Device: Arm sling Ortho Device/Splint Interventions: Application   [name] 11/05/2015, 1:23 PM

## 2015-11-05 NOTE — Discharge Instructions (Signed)

## 2015-11-05 NOTE — ED Notes (Signed)
Brought patient to room with family in tow; patient given a gown to place on; visitor at bedside; Prom, EMT present in room

## 2015-11-05 NOTE — Op Note (Signed)
NAME:  Kathy Willis, Kathy Willis                ACCOUNT NO.:  [id number]  MEDICAL RECORD NO.:  [id number]  LOCATION:  [facility]                         FACILITY:  [facility]  PHYSICIAN:  Dionne AnoWilliam M. Dalin Caldera, M.D.DATE OF BIRTH:  Mar 05, 1981  DATE OF PROCEDURE: DATE OF DISCHARGE:                              OPERATIVE REPORT   PREOP DIAGNOSIS:  Right small finger flexor digitorum profundus and superficialis laceration rule out nerve injury associated with this knife laceration.  POSTOP DIAGNOSIS:  Right small finger flexor digitorum profundus and superficialis laceration rule out nerve injury associated with this knife laceration with partial laceration to the ulnar digital nerve to the small finger and complete FDP FDS injury in zone 2, about the small finger.  SURGICAL PROCEDURE PERFORMED: 1. Irrigation and debridement of skin, subcutaneous tissue, tendon,     and periosteal tissue, right small finger.  This was an excisional     debridement in nature with knife, blade, curette, and scissor tip. 2. Zone 2, flexor digitorum superficialis tendon repair. 3. Zone 2, flexor digitorum profundus, flexor tendon repair. 4. Ulnar digital nerve repair 50% repair right small finger.  This is     all right small finger.  SURGEON:  Dionne AnoWilliam M. Amanda PeaGramig, M.D.  ASSISTANT:  Karie ChimeraBrian Buchanan, P.A.-C.  COMPLICATIONS:  None.  ANESTHESIA:  General with preoperative block.  TOURNIQUET TIME:  Less than an hour.  INDICATIONS FOR THE PROCEDURE:  A 35 year old female, who presents after laceration with a knife.  She complains of pain and swelling.  I counseled her in regard to risks and benefits of surgery, and our proposed treatment algorithm in the operating room.  We had a long discussion preoperatively in the emergency room and then prepped her for surgery.  She understands passive flexion, active extension protocol after a flexor tendon repair and seems to be very engaged in trying to get herself better.  I have  discussed her these issues at length, my concerns, the risks and benefits, do's and don'ts, timeframe, duration of recovery, and other issues as they are remain her predicament.  With this in mind, we will proceed accordingly.  OPERATIVE PROCEDURE:  The patient was seen by myself and Anesthesia. She was given a block in the preop holding area.  She was prepped and draped in the operative theater with Hibiclens scrub, followed by 10 minutes surgical Betadine scrub and paint.  Preoperative antibiotics were given in the form of Ancef and a block had been placed by the Anesthesia Department at my request.  Once this was complete, we then evaluated the wound.  The patient underwent initial irrigation debridement of skin, subcutaneous tissue, tendon, paratenon tissue and periosteal tissue.  This was an excisional debridement with knife, blade, scissor and curette.  Copious amounts of saline were placed through and through the area in question.  There were no complicating features.  Following this, the patient then underwent a very careful and cautious approach to the extremity with extensions of the wound proximally and distally.  The patient tolerated this well.  Once this was performed the patient had skin flaps created and I explored the radial digital nerve and artery which were intact.  The ulnar digital  nerve and artery were also explored and there was a 50% laceration to the ulnar digital nerve.  Following this, I then looked at the flexor apparatus.  The FDS and FDP had zone 2 injuries.  This was somewhat distal in nature.  This juncture we look at retrieving through the main wound the FDP which was significantly coiled however, this was unable to be performed.  Thus we made an incision in the palm transverse in nature.  Dissection was carried down.  The FDP was evaluated and retrieved.  It was then threaded through the Camper of chiasm very carefully, and following this, underwent  a very careful and cautious approach to the extremity with FiberWire placement proximal and distal.  I used a window technique in the pulley system.  I placed a modified Kessler to [employer] stitch of 4- 0 FiberWire in the proximal and distal ends.  I threaded the FDP through the distal pulley system and then tied this down with an additional box/Kessler stitch FiberWire for 4 strand FiberWire repair of the 4-0 variety.  A 7-0 Prolene epi tendinous suture was used in addition to this, to make sure that there was no bunching of the tendon edges.  The patient tolerated this well.  There were no complicating features.  Following this, the FDS was repaired similarly.  The FDS had most notable changes ulnarly where it was injured.  This repaired without complicating features.  Following this, the digital nerve partial injury was repaired under 4.5 expanded loupe magnification with a 10-0 nylon.  This coapted the open area nicely and had no problems with the tension.  Following this, we irrigated copiously and deflated the tourniquet, we closed the wound with Prolene.  The patient tolerated this well.  There were no complicating features.  Following the patient's closure a dorsal blocking splint out to the fingertips was placed.  Once again, I spent extensive time in the ER discussing with her the proposed treatment algorithm postop.  She understands not to flex the fingers.  She will be discharged home on Keflex and appropriate pain management. We will see her back in the office in 7-10 days.  I will try to go ahead and prearrange the therapy appointment for her through [facility] therapy.  These notes have been discussed.  All questions have been encouraged and answered.  Pleasure to see her today.  We certainly wish you the best.  I should note that there were no immediate complicating features.     Dionne Ano. Amanda Pea, M.D.     [facility]  D:  11/05/2015  T:  11/05/2015  Job:   [id number]

## 2015-11-05 NOTE — Op Note (Signed)
See [id number]#[id number] Amanda PeaGramig MD

## 2015-11-05 NOTE — Anesthesia Preprocedure Evaluation (Addendum)
Anesthesia Evaluation  Patient identified by MRN, date of birth, ID band Patient awake    Reviewed: Allergy & Precautions, H&P , NPO status , Patient's Chart, lab work & pertinent test results  History of Anesthesia Complications Negative for: history of anesthetic complications  Airway Mallampati: II  TM Distance: >3 FB Neck ROM: full    Dental no notable dental hx.    Pulmonary Current Smoker,    Pulmonary exam normal breath sounds clear to auscultation       Cardiovascular negative cardio ROS Normal cardiovascular exam Rhythm:regular Rate:Normal     Neuro/Psych negative neurological ROS     GI/Hepatic negative GI ROS, Neg liver ROS,   Endo/Other  negative endocrine ROS  Renal/GU negative Renal ROS     Musculoskeletal   Abdominal   Peds  Hematology negative hematology ROS (+)   Anesthesia Other Findings   Reproductive/Obstetrics                            Anesthesia Physical Anesthesia Plan  ASA: I  Anesthesia Plan: General and Regional   Post-op Pain Management: GA combined w/ Regional for post-op pain   Induction: Intravenous  Airway Management Planned: LMA  Additional Equipment:   Intra-op Plan:   Post-operative Plan: Extubation in OR  Informed Consent: I have reviewed the patients History and Physical, chart, labs and discussed the procedure including the risks, benefits and alternatives for the proposed anesthesia with the patient or authorized representative who has indicated his/her understanding and acceptance.   Dental Advisory Given  Plan Discussed with: Anesthesiologist, CRNA and Surgeon  Anesthesia Plan Comments:         Anesthesia Quick Evaluation

## 2015-11-05 NOTE — Anesthesia Procedure Notes (Addendum)
Anesthesia Regional Block:  Supraclavicular block  Pre-Anesthetic Checklist: ,, timeout performed, Correct Patient, Correct Site, Correct Laterality, Correct Procedure, Correct Position, site marked, Risks and benefits discussed,  Surgical consent,  Pre-op evaluation,  At surgeon's request and post-op pain management  Laterality: Right  Prep: chloraprep       Needles:  Injection technique: Single-shot  Needle Type: Echogenic Stimulator Needle     Needle Length: 9cm 9 cm Needle Gauge: 21 and 21 G    Additional Needles:  Procedures: ultrasound guided (picture in chart) Supraclavicular block Narrative:  Injection made incrementally with aspirations every 5 mL.  Performed by: Personally  Anesthesiologist: JUDD, BENJAMIN  Additional Notes: Risks, benefits and alternative to block explained extensively.  Patient tolerated procedure well, without complications.   Procedure Name: LMA Insertion Date/Time: 11/05/2015 11:03 AM Performed by: Sarita HaverFLOWERS, Dawud Mays T Pre-anesthesia Checklist: Patient identified, Timeout performed, Emergency Drugs available, Suction available and Patient being monitored Patient Re-evaluated:Patient Re-evaluated prior to inductionOxygen Delivery Method: Circle system utilized and Simple face mask Preoxygenation: Pre-oxygenation with 100% oxygen Intubation Type: IV induction Ventilation: Mask ventilation without difficulty LMA: LMA inserted LMA Size: 4.0 Number of attempts: 1 Airway Equipment and Method: Patient positioned with wedge pillow Placement Confirmation: positive ETCO2 and breath sounds checked- equal and bilateral Tube secured with: Tape Dental Injury: Teeth and Oropharynx as per pre-operative assessment

## 2015-11-05 NOTE — Progress Notes (Signed)
Per Dr. Amanda PeaGramig and Dr. Gentry RochJudd patient can leave areola rings in.

## 2015-11-05 NOTE — ED Notes (Signed)
Patient is requesting ice chips, explained NPO status if surgery is considered. She acknowledges, and is given 2 ice chips for comfort as she called out irate in regards to not receiving any. Family requested ice also, gave as requested, and instructed not for patient consumption.

## 2015-11-05 NOTE — ED Provider Notes (Signed)
CSN: [id number]     Arrival date & time 11/05/15  4098 History   First MD Initiated Contact with Patient 11/05/15 0606     Chief Complaint  Patient presents with  . Laceration     (Consider location/radiation/quality/duration/timing/severity/associated sxs/prior Treatment) HPI Comments: Patient is a 35yo female who presents today with a laceration to the R PIP of the 5th digit. Patient had been drinking and went to cut some fruit about 2 hours ago and grabbed a pairing knife from the drainer the wrong way. 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 Patient received Tdap during her visit today. Last PO food was 6:30pm last evening, was drinking alcohol until 2am.  Patient is a 35 y.o. female presenting with skin laceration. The history is provided by the patient. No language interpreter was used.  Laceration   History reviewed. No pertinent past medical history. History reviewed. No pertinent past surgical history. No family history on file. Social History  Substance Use Topics  . Smoking status: Current Every Day Smoker -- 0.50 packs/day  . Smokeless tobacco: None  . Alcohol Use: Yes     Comment: occasion   OB History    Gravida Para Term Preterm AB TAB SAB Ectopic Multiple Living   Review of Systems  Respiratory: Negative for shortness of breath.   Cardiovascular: Negative for chest pain.  Gastrointestinal: Negative for nausea and vomiting.  Skin: Positive for wound.      Allergies  Shellfish allergy  Home Medications   Prior to Admission medications   Medication Sig Start Date End Date Taking? Authorizing Provider  ibuprofen (ADVIL,MOTRIN) 800 MG tablet Take 1 tablet (800 mg total) by mouth 3 (three) times daily. Patient not taking: Reported on 11/05/2015 11/13/14   Santiago Glad, PA-C  traMADol (ULTRAM) 50 MG tablet Take 1 tablet (50 mg total) by mouth every  6 (six) hours as needed. Patient not taking: Reported on 11/05/2015 11/16/14   Dione Booze, MD   BP 108/76 mmHg  Pulse 89  Temp(Src) 97.3 F (36.3 C) (Oral)  Resp 18  Ht  (1.6 m)  Wt 53.524 kg  BMI 20.91 kg/m2  SpO2 100%  LMP 10/21/2015 Physical Exam  Constitutional: She appears well-developed and well-nourished. No distress.  HENT:  Head: Normocephalic and atraumatic.  Eyes: Conjunctivae are normal. Right eye exhibits no discharge. Left eye exhibits no discharge. No scleral icterus.  Cardiovascular: Normal rate, regular rhythm, normal heart sounds and intact distal pulses.  Exam reveals no gallop and no friction rub.   No murmur heard. Pulmonary/Chest: Effort normal and breath sounds normal. No respiratory distress. She has no wheezes. She has no rales.  Musculoskeletal:       Hands: 1.5cm laceration over PIP on palmar aspect R hand; Full flexion at PIP, unable to move DIP, sensation intact, cap refill less than 2 seconds  Neurological: She is alert.  Skin: She is not diaphoretic.    ED Course  .Marland KitchenLaceration Repair Date/Time: 11/05/2015 7:38 AM Performed by: Emi Holes Authorized by: Emi Holes Consent: Verbal consent obtained. Risks and benefits: risks, benefits and alternatives were discussed Body area: upper extremity Location details: right small finger Laceration length: 1.5 cm Foreign bodies: no foreign bodies Nerve involvement: none Vascular damage: no Anesthesia: digital block Local anesthetic: lidocaine 1% with  epinephrine Anesthetic total: 5 ml Patient sedated: no Irrigation solution: saline Irrigation method: syringe Debridement: none Degree of undermining: none Skin closure: Ethilon and 5-0 nylon Number of sutures: 1 Technique: simple Approximation: loose Approximation difficulty: simple Dressing: gauze roll and 4x4 sterile gauze   (including critical care time) Labs Review Labs Reviewed - No data to display  Imaging Review Dg  Finger Little Right  11/05/2015  CLINICAL DATA:  35 year old female with laceration to the anterior aspect of the fifth digit. EXAM: RIGHT LITTLE FINGER 2+V COMPARISON:  None. FINDINGS: There is no acute fracture or dislocation. The bones are well mineralized. No significant soft tissue swelling. No radiopaque foreign object. IMPRESSION: Negative. Electronically Signed   By: Elgie CollardArash  Radparvar M.D.   On: 11/05/2015 05:32   I have personally reviewed and evaluated these images and lab results as part of my medical decision-making.   EKG Interpretation None      6:40am Trixie DredgeEmily West, PA-C spoke with Dr. Amanda PeaGramig who will come see the patient within the hour. We will put one stitch in her finger and Dr. Amanda PeaGramig will schedule her in the OR tomorrow.  8:47 Dr. Amanda PeaGramig saw the patient and will take patient to OR this morning. IV placed and wound care ordered.  MDM   Patient is a 35yo F who presents after cutting her R 5th digit with a pairing knife. Laceration 1.5cm. Tendon is affected. Patient can move PIP, but not PIP. Patient's pain was moderately controlled with Percocet in ED. 1 suture was placed before Dr. Amanda PeaGramig consulted who examaned the patient in the ED. He will take her to the OR today. PIease see his notes for further details. IV access was placed and wound was wrapped in preparation for OR transport. Pt given IV Zofran in ED prior to transport. Patient understands and agrees with the plan.  Final diagnoses:  Finger laceration involving tendon, initial encounter       Emi Holeslexandra M Cadell Gabrielson, PA-C 11/05/15 1033  Laurence Spatesachel Morgan Little, MD 11/05/15 [phone]

## 2015-11-05 NOTE — H&P (Signed)
Kathy Willis is an 35 y.o. female.   Chief Complaint: Zone 2 flexor tendon injury right small finger HPI: Patient presents with a zone 2 flexor tendon injury right small finger. Patient has complete loss of flexion. Extension is intact. She denies other issue. She is been nothing by mouth in terms of food by mouth since approximately 6pm last night  She denies other injury tonight. She denies history of blood disorder diabetes infection dystrophy or other problems.  I discussed her all issues.  She denies neck back chest or abdominal pain  History reviewed. No pertinent past medical history.  History reviewed. No pertinent past surgical history.  No family history on file. Social History:  reports that she has been smoking.  She does not have any smokeless tobacco history on file. She reports that she drinks alcohol. She reports that she uses illicit drugs (Marijuana).  Allergies:  Allergies  Allergen Reactions  . Shellfish Allergy Anaphylaxis     (Not in a hospital admission)  No results found for this or any previous visit (from the past 48 hour(s)). Dg Finger Little Right  11/05/2015  CLINICAL DATA:  35 year old female with laceration to the anterior aspect of the fifth digit. EXAM: RIGHT LITTLE FINGER 2+V COMPARISON:  None. FINDINGS: There is no acute fracture or dislocation. The bones are well mineralized. No significant soft tissue swelling. No radiopaque foreign object. IMPRESSION: Negative. Electronically Signed   By: Elgie Collard M.D.   On: 11/05/2015 05:32    Review of Systems  HENT: Negative.   Eyes: Negative.   Respiratory: Negative.   Cardiovascular: Negative.   Gastrointestinal: Negative.   Genitourinary: Negative.   Neurological: Negative.   Endo/Heme/Allergies: Negative.     Blood pressure 108/76, pulse 89, temperature 97.3 F (36.3 C), temperature source Oral, resp. rate 18, height  (1.6 m), weight 53.524 kg (118 lb), last menstrual period  10/21/2015, SpO2 100 %, unknown if currently breastfeeding. Physical Exam  Zone 2 flexor tendon injury right small finger with loss of flexion about the FDP and FDS tendons  She has refill to the finger fortunately. Sensation is difficult to evaluate due to a prior block.  She and I reviewed all issues at length.  The opposite extremity and adjacent digits are neurovascularly intact without compromise  The patient is alert and oriented in no acute distress. The patient complains of pain in the affected upper extremity.  The patient is noted to have a normal HEENT exam. Lung fields show equal chest expansion and no shortness of breath. Abdomen exam is nontender without distention. Lower extremity examination does not show any fracture dislocation or blood clot symptoms. Pelvis is stable and the neck and back are stable and nontender. Assessment/Plan Zone 2 flexor tendon injury right small finger status post laceration with a knife Patient understands the challenges and necessary rehabilitation efforts after flexor tendon repair. She understands we will plan to proceed with flexor tendon repair reconstruction and irrigation debridement.  I discussed her the risk and benefits as well as a Duran program postoperatively  We will plan to proceed with irrigation debridement repair reconstruction.  I discussed her all issues.  We are planning surgery for your upper extremity. The risk and benefits of surgery to include risk of bleeding, infection, anesthesia,  damage to normal structures and failure of the surgery to accomplish its intended goals of relieving symptoms and restoring function have been discussed in detail. With this in mind we plan to proceed. I  have specifically discussed with the patient the pre-and postoperative regime and the dos and don'ts and risk and benefits in great detail. Risk and benefits of surgery also include risk of dystrophy(CRPS), chronic nerve pain, failure of the  healing process to go onto completion and other inherent risks of surgery The relavent the pathophysiology of the disease/injury process, as well as the alternatives for treatment and postoperative course of action has been discussed in great detail with the patient who desires to proceed.  We will do everything in our power to help you (the patient) restore function to the upper extremity. It is a pleasure to see this patient today.  Karen ChafeGRAMIG III,Kaidin Boehle M, MD 11/05/2015, 9:00 AM

## 2015-11-07 ENCOUNTER — Encounter (HOSPITAL_COMMUNITY): Payer: Self-pay | Admitting: Orthopedic Surgery

## 2015-11-09 ENCOUNTER — Ambulatory Visit: Payer: Self-pay | Attending: Orthopedic Surgery | Admitting: Occupational Therapy

## 2015-11-09 DIAGNOSIS — M6289 Other specified disorders of muscle: Secondary | ICD-10-CM | POA: Insufficient documentation

## 2015-11-09 DIAGNOSIS — R29898 Other symptoms and signs involving the musculoskeletal system: Secondary | ICD-10-CM

## 2015-11-09 DIAGNOSIS — M25641 Stiffness of right hand, not elsewhere classified: Secondary | ICD-10-CM

## 2015-11-09 DIAGNOSIS — M79641 Pain in right hand: Secondary | ICD-10-CM

## 2015-11-09 NOTE — Patient Instructions (Signed)
WEARING SCHEDULE:  Wear splint at ALL times except for hygiene care  PURPOSE:  To prevent movement and for protection until injury can heal  CARE OF SPLINT:  Keep splint away from heat sources including: stove, radiator or furnace, or a car in sunlight. The splint can melt and will no longer fit you properly  Keep away from pets and children  Clean the splint with rubbing alcohol 1-2 times per day.  * During this time, make sure you also clean your hand/arm as instructed by your therapist and/or perform dressing changes as needed. Then dry hand/arm completely before replacing splint. (When cleaning hand/arm, keep it immobilized in same position until splint is replaced)  PRECAUTIONS/POTENTIAL PROBLEMS: *If you notice or experience increased pain, swelling, numbness, or a lingering reddened area from the splint: Contact your therapist immediately by calling [phone]. You must wear the splint for protection, but we will get you scheduled for adjustments as quickly as possible.  (If only straps or hooks need to be replaced and NO adjustments to the splint need to be made, just call the office ahead and let them know you are coming in)  If you have any medical concerns or signs of infection, please call your doctor immediately  Remove finger strap only to perform exercises within splint!!!

## 2015-11-09 NOTE — Therapy (Signed)
[facility] [address] [place], [place], [address] Phone: [phone]   Fax:  [phone]  Occupational Therapy Evaluation  Patient Details  Name: Kathy Willis MRN: [id number] Date of Birth: 11/07/1980 Referring Provider: Dr. Amanda PeaGramig  Encounter Date: 11/09/2015      OT End of Session - 11/09/15 1700    Visit Number 1   Number of Visits 25   Date for OT Re-Evaluation 02/09/16   Authorization Type self pay   OT Start Time 1107   OT Stop Time 1230   OT Time Calculation (min) 83 min   Activity Tolerance Patient tolerated treatment well   Behavior [facility] for tasks assessed/performed      No past medical history on file.  Past Surgical History  Procedure Laterality Date  . Tendon repair Right 11/05/2015    Procedure: TENDON REPAIR RIGHT SMALL FINGER;  Surgeon: Dominica SeverinWilliam Gramig, MD;  Location: [facility] OR;  Service: Orthopedics;  Laterality: Right;    There were no vitals filed for this visit.  Visit Diagnosis:  Pain in right hand - Plan: Ot plan of care cert/re-cert  Stiffness of joint, hand, right - Plan: Ot plan of care cert/re-cert  Right hand weakness - Plan: Ot plan of care cert/re-cert      Subjective Assessment - 11/09/15 1727    Subjective  Pt s/p Zone II flexor tendon repair after cutting hand   Pertinent History see Epic   Patient Stated Goals to return to work   Currently in Pain? Yes   Pain Score 5    Pain Location Finger (Comment which one)   Pain Orientation Right  small finger   Pain Descriptors / Indicators Aching;Sharp   Pain Type Acute pain   Pain Onset In the past 7 days   Aggravating Factors  movement   Pain Relieving Factors inactivity   Multiple Pain Sites No           [facility] OT Assessment - 11/09/15 0001    Assessment   Diagnosis zone II flexor tendon repair for right small finger, ulnar digital n. repair   Referring Provider Dr. Amanda PeaGramig   Onset Date 11/05/15   Assessment Pt s/p zone II flexor tendon repair presents with decreased RUE ROM and functional use which impedes ADLs/IADLs.   Precautions   Precautions Other (comment)   Precaution Comments splint at all times except hygeine, follow zone II modified Duran protocol,   In splint P/ROM/active finger ext per Dr. Carlos LeveringGramig's op report   Home  Environment   Family/patient expects to be discharged to: Private residence   Lives With Significant other   Prior Function   Level of Independence Needs assistance with ADLs   Vocation Full time employment   Vocation Requirements personal care attendent   ADL   ADL comments Pt requires assist with ADLS/ home management as she is unable to use RUE functionally due to precautions   Mobility   Mobility Status Independent   Written Expression   Dominant Hand Right   ROM / Strength   AROM / PROM / Strength AROM   AROM   Overall AROM  Deficits;Unable to assess;Due to precautions                  OT Treatments/Exercises (OP) - 11/09/15 0001    Splinting   Splinting Pt arrived in protective cast, yet finger tips were exposed. Pt reports she had been moving fingers. Pt was carefully unwrapped and hand  was cleaned with soap and water. Pt was cautioned regarding using RUE .Pt's small finger and hand were dressed with stockinette. Pt was fitted with a dorsal blocking splint with wrist in approx 20 flexion MP's grossly 60 *. Pt was educated in splint wear / hand hygeine, dressing changes, and inital HEP within splint for P/ROM flexion and A/ROM finger ext. Pt verbalized understanding of all education.                 OT Short Term Goals - 11/09/15 1706    OT SHORT TERM GOAL #1   Title I with HEP.   Time 6   Period Weeks   Status New   OT SHORT TERM GOAL #2   Title I with splint wear, care and precautions.   Time 6   Period Weeks   Status New   OT SHORT TERM GOAL #3   Title Pt will perform HEP with pain no greater than 3/10.   Time 6    Period Weeks   Status New           OT Long Term Goals - 11/09/15 1708    OT LONG TERM GOAL #1   Title I with updated HEP.   Time 12   Period Weeks   Status New   OT LONG TERM GOAL #2   Title Pt will demonstrate 95% composite finger flexion for increased functional use.   Time 12   Period Weeks   Status New   OT LONG TERM GOAL #3   Title Pt will demonstrate 30 lbs of grip strength in right hand for increased functional use.   Time 12   Period Weeks   Status New   OT LONG TERM GOAL #4   Title Pt will resume use of RUE as dominant hand at least 95% of the time with pain less than or equal to 3/10.   Time 12   Period Weeks   Status New               Plan - 11/09/15 1701    Clinical Impression Statement Pt s/p zone II flexor tendon repair(FDS/FDP)right small finger on 11/05/15 by Dr Amanda Pea. Pt presents with pain, decreased ROM, and decreased strength which impedes performance of ADLS/IADLS. Pt. can benefit from skilled occupational therapy.   Rehab Potential Good   OT Frequency 2x / week   OT Duration 12 weeks  plus eval   OT Treatment/Interventions Self-care/ADL training;Therapeutic exercise;Patient/family education;Splinting;Neuromuscular education;Ultrasound;Therapeutic exercises;Therapeutic activities;DME and/or AE instruction;Parrafin;Cryotherapy;Electrical Stimulation;Fluidtherapy;Scar mobilization;Passive range of motion;Contrast Bath;Moist Heat   Plan progress per modified duran zone II protocol(P/ROM flexion with active extension in splint initated per MD hospital notes)   OT Home Exercise Plan issued P/ROM flexion with active ext in splint issued per MD hospital notes.   Consulted and Agree with Plan of Care Patient;Family member/caregiver        Problem List There are no active problems to display for this patient.   Kathy Willis 11/09/2015, 5:30 PM Keene Breath, OTR/L [phone] Phone: [phone] 5:30 PM 11/09/2015 [facility]  Rehabilitation [facility] [address] [place], [place], [address] Phone: [phone]   Fax:  [phone]  Name: Kathy Willis MRN: [id number] Date of Birth: Mar 23, 1981

## 2015-11-10 NOTE — Anesthesia Postprocedure Evaluation (Signed)
Anesthesia Post Note  Patient: Kathy Willis  Procedure(s) Performed: Procedure(s) (LRB): TENDON REPAIR RIGHT SMALL FINGER (Right)  Patient location during evaluation: PACU Anesthesia Type: General Level of consciousness: awake and alert Pain management: pain level controlled Vital Signs Assessment: post-procedure vital signs reviewed and stable Respiratory status: spontaneous breathing, nonlabored ventilation, respiratory function stable and patient connected to nasal cannula oxygen Cardiovascular status: blood pressure returned to baseline and stable Postop Assessment: no signs of nausea or vomiting Anesthetic complications: no    Last Vitals:  Filed Vitals:   11/05/15 1330 11/05/15 1345  BP: 108/77 113/76  Pulse: 82 71  Temp: 36.6 C   Resp: 15 15    Last Pain:  Filed Vitals:   11/09/15 1558  PainSc: 4                  [name], Adaia Matthies J

## 2015-11-14 ENCOUNTER — Ambulatory Visit: Payer: Self-pay | Admitting: Occupational Therapy

## 2015-11-16 ENCOUNTER — Encounter: Payer: Self-pay | Admitting: Occupational Therapy

## 2015-11-16 ENCOUNTER — Ambulatory Visit: Payer: Self-pay | Attending: Orthopedic Surgery | Admitting: Occupational Therapy

## 2015-11-16 DIAGNOSIS — M25641 Stiffness of right hand, not elsewhere classified: Secondary | ICD-10-CM

## 2015-11-16 DIAGNOSIS — R29898 Other symptoms and signs involving the musculoskeletal system: Secondary | ICD-10-CM

## 2015-11-16 DIAGNOSIS — M79641 Pain in right hand: Secondary | ICD-10-CM

## 2015-11-16 NOTE — Therapy (Signed)
[facility] [address] [place], [place], [address] Phone: [phone]   Fax:  [phone]  Occupational Therapy Treatment  Patient Details  Name: TYLASIA FLETCHALL MRN: [id number] Date of Birth: 1980/12/07 Referring Provider: Dr. Amanda Pea  Encounter Date: 11/16/2015      OT End of Session - 11/16/15 1330    Visit Number 2   Number of Visits 25   Date for OT Re-Evaluation 02/09/16   Authorization Type self pay   OT Start Time 1230   OT Stop Time 1310   OT Time Calculation (min) 40 min   Activity Tolerance Patient tolerated treatment well      History reviewed. No pertinent past medical history.  Past Surgical History  Procedure Laterality Date  . Tendon repair Right 11/05/2015    Procedure: TENDON REPAIR RIGHT SMALL FINGER;  Surgeon: Dominica Severin, MD;  Location: [facility] OR;  Service: Orthopedics;  Laterality: Right;    There were no vitals filed for this visit.  Visit Diagnosis:  Pain in right hand  Stiffness of right hand, not elsewhere classified  Other symptoms and signs involving the musculoskeletal system      Subjective Assessment - 11/16/15 1321    Subjective  I don't have a follow up scheduled with Dr. Amanda Pea   Pertinent History s/p Zone II flexor tendon repair Rt small finger 11/05/15   Limitations per Modified Duran protocol    Patient Stated Goals to return to work   Currently in Pain? Yes   Pain Score 5    Pain Location Finger (Comment which one)  small finger   Pain Orientation Right   Pain Descriptors / Indicators Aching   Pain Type Surgical pain   Pain Onset 1 to 4 weeks ago   Pain Frequency Intermittent   Aggravating Factors  movement   Pain Relieving Factors inactivity                      OT Treatments/Exercises (OP) - 11/16/15 0001    ADLs   ADL Comments Pt instructed to call MD office and set up f/u MD appt ASAP. Reviewed hygiene care, precautions, risk of rupture if  not following protocol and HEP. Also discussed length of recovery and when she is able to begin something new.    Exercises   Exercises Hand   Hand Exercises   Other Hand Exercises Per Modified Duran protocol and per instructions in Op notes by MD - Pt return demo of passive flexion and active extension within confines of splint for Rt hand. Pt demo good mobility of small finger at this time in protocol with min stiffness which is to be expected. Tendons still appear intact   Splinting   Splinting Replaced some velcro hooks and straps of splint. Pt also provided new finger compression stockinette. Reviewed wear and care of splint.                   OT Short Term Goals - 11/09/15 1706    OT SHORT TERM GOAL #1   Title I with HEP.   Time 6   Period Weeks   Status New   OT SHORT TERM GOAL #2   Title I with splint wear, care and precautions.   Time 6   Period Weeks   Status New   OT SHORT TERM GOAL #3   Title Pt will perform HEP with pain no greater than 3/10.   Time 6  Period Weeks   Status New           OT Long Term Goals - 11/09/15 1708    OT LONG TERM GOAL #1   Title I with updated HEP.   Time 12   Period Weeks   Status New   OT LONG TERM GOAL #2   Title Pt will demonstrate 95% composite finger flexion for increased functional use.   Time 12   Period Weeks   Status New   OT LONG TERM GOAL #3   Title Pt will demonstrate 30 lbs of grip strength in right hand for increased functional use.   Time 12   Period Weeks   Status New   OT LONG TERM GOAL #4   Title Pt will resume use of RUE as dominant hand at least 95% of the time with pain less than or equal to 3/10.   Time 12   Period Weeks   Status New               Plan - 11/16/15 1331    Clinical Impression Statement Pt with increased understanding of flexor tendon rehab protocol and precautions. Pt demo initial HEP within confines of splint.    Plan progress as able per modified duran protocol  (zone II), scar massage when stitches out   OT Home Exercise Plan issued P/ROM flexion with active ext in splint issued per MD hospital notes.   Consulted and Agree with Plan of Care Patient;Family member/caregiver        Problem List There are no active problems to display for this patient.   Kelli ChurnBallie, Shatika Grinnell Johnson, OTR/L 11/16/2015, 1:33 PM  [facility] [address] [place], [place], [address] Phone: [phone]   Fax:  [phone]  Name: Adele Barthelamara D Keltz MRN: [id number] Date of Birth: 08/15/1980

## 2015-11-21 NOTE — Addendum Note (Signed)
Addended by: Sheran LawlessBALLIE, Ilianna Bown J on: 11/21/2015 01:44 PM   Modules accepted: Orders

## 2015-11-22 ENCOUNTER — Ambulatory Visit: Payer: Self-pay | Admitting: Occupational Therapy

## 2015-11-29 ENCOUNTER — Ambulatory Visit: Payer: Self-pay | Admitting: Occupational Therapy

## 2015-12-01 ENCOUNTER — Ambulatory Visit: Payer: Self-pay | Admitting: Occupational Therapy

## 2015-12-01 ENCOUNTER — Encounter: Payer: Self-pay | Admitting: Occupational Therapy

## 2015-12-01 DIAGNOSIS — M79641 Pain in right hand: Secondary | ICD-10-CM

## 2015-12-01 DIAGNOSIS — M25641 Stiffness of right hand, not elsewhere classified: Secondary | ICD-10-CM

## 2015-12-01 NOTE — Therapy (Signed)
[facility] [address] [place], [place], [address] Phone: [phone]   Fax:  [phone]  Occupational Therapy Treatment  Patient Details  Name: Kathy Willis MRN: [id number] Date of Birth: 12-Sep-1980 Referring Provider: Dr. Amanda Pea  Encounter Date: 12/01/2015      OT End of Session - 12/01/15 1619    Visit Number 3   Number of Visits 25   Date for OT Re-Evaluation 02/09/16   Authorization Type self pay   OT Start Time 1315   OT Stop Time 1400   OT Time Calculation (min) 45 min   Activity Tolerance Patient tolerated treatment well      History reviewed. No pertinent past medical history.  Past Surgical History  Procedure Laterality Date  . Tendon repair Right 11/05/2015    Procedure: TENDON REPAIR RIGHT SMALL FINGER;  Surgeon: Dominica Severin, MD;  Location: [facility] OR;  Service: Orthopedics;  Laterality: Right;    There were no vitals filed for this visit.      Subjective Assessment - 12/01/15 1342    Subjective  I got my stitches out Monday   Pertinent History s/p Zone II flexor tendon repair Rt small finger 11/05/15   Limitations per Modified Duran protocol    Patient Stated Goals to return to work   Currently in Pain? Yes   Pain Score --  fluctuates 0-10/10   Pain Location Finger (Comment which one)  SMALL finger   Pain Orientation Right   Pain Descriptors / Indicators Aching   Pain Type Surgical pain   Pain Onset 1 to 4 weeks ago   Pain Frequency Intermittent   Aggravating Factors  rubbing it   Pain Relieving Factors elevation, meds                      OT Treatments/Exercises (OP) - 12/01/15 0001    ADLs   ADL Comments Pt instructed in scar massage and desensitization techniques (stitches removed 11/28/15). Therapist performed scar massage - pt hypersensitive. Instructed pt and spouse to perform scar massage to finger inside splint until next week's appointment and issued foam to  place b/t finger and splint for comfort when performing. OK to remove splint for scar massage to palm as long as MP joints stay flexed - pt/spouse verbalized understanding. Therapist also replaced all straps again per pt request   Hand Exercises   Other Hand Exercises Pt now 3 1/2 weeks post-op. In addition to previous HEP, added active finger flexion within confines of DBS (per protocol). Pt return demo. Pt also asked to place and hold for further ROM and return demo                OT Education - 12/01/15 1350    Education provided Yes   Education Details Updated HEP for active flexion within restraint of DBS (Per 3 1/2 week post op protocol), scar massage   Person(s) Educated Patient   Methods Explanation;Demonstration;Handout   Comprehension Verbalized understanding;Returned demonstration          OT Short Term Goals - 12/01/15 1619    OT SHORT TERM GOAL #1   Title I with HEP.   Time 6   Period Weeks   Status Achieved   OT SHORT TERM GOAL #2   Title I with splint wear, care and precautions.   Time 6   Period Weeks   Status Achieved   OT SHORT TERM GOAL #3   Title Pt will  perform HEP with pain no greater than 3/10.   Time 6   Period Weeks   Status On-going           OT Long Term Goals - 11/09/15 1708    OT LONG TERM GOAL #1   Title I with updated HEP.   Time 12   Period Weeks   Status New   OT LONG TERM GOAL #2   Title Pt will demonstrate 95% composite finger flexion for increased functional use.   Time 12   Period Weeks   Status New   OT LONG TERM GOAL #3   Title Pt will demonstrate 30 lbs of grip strength in right hand for increased functional use.   Time 12   Period Weeks   Status New   OT LONG TERM GOAL #4   Title Pt will resume use of RUE as dominant hand at least 95% of the time with pain less than or equal to 3/10.   Time 12   Period Weeks   Status New               Plan - 12/01/15 1620    Clinical Impression Statement Pt  progressing per protocol - pt now 3 1/2 weeks post-op.  Pt with noted hypersensitivity today over incision (stitches removed 11/28/15).    Rehab Potential Poor   OT Frequency 1x / week  per pt request   OT Treatment/Interventions Self-care/ADL training;Therapeutic exercise;Patient/family education;Splinting;Neuromuscular education;Ultrasound;Therapeutic exercises;Therapeutic activities;DME and/or AE instruction;Parrafin;Cryotherapy;Electrical Stimulation;Fluidtherapy;Scar mobilization;Passive range of motion;Contrast Bath;Moist Heat   Plan ultrasound over palmer incision and base of small finger, progress with protocol - 4 1/2 weeks post op at next appt (Pt did not wish to come 2x/wk d/t being self pay and will continue at 1x/wk)   OT Home Exercise Plan issued P/ROM flexion with active ext in splint issued per MD hospital notes.   Consulted and Agree with Plan of Care Patient;Family member/caregiver      Patient will benefit from skilled therapeutic intervention in order to improve the following deficits and impairments:  Decreased coordination, Decreased range of motion, Impaired flexibility, Improper body mechanics, Decreased safety awareness, Increased edema, Impaired sensation, Decreased knowledge of precautions, Decreased activity tolerance, Decreased skin integrity, Decreased scar mobility, Impaired UE functional use, Pain, Decreased strength  Visit Diagnosis: Pain in right hand  Stiffness of right hand, not elsewhere classified    Problem List There are no active problems to display for this patient.   Kelli ChurnBallie, Wilma Michaelson Johnson, OTR/L 12/01/2015, 4:23 PM  [facility] [address] [place], [place], [address] Phone: [phone]   Fax:  [phone]  Name: Kathy Willis MRN: [id number] Date of Birth: 12/28/1980

## 2015-12-01 NOTE — Patient Instructions (Signed)
  SCAR MASSAGE:   Deep circles along incision with cocoa butter or Vitamin E cream for 5 minutes, 2x/day. (Keep splint on when performing scar massage to finger, take off for scar massage to palm but keep fingers bent at big knuckles)    ADDITIONAL EXERCISE: Keep splint ON  Continue with initial HEP. In addition, can now actively flex hand into gentle fist, passively stretch further with other hand, then hold position.

## 2015-12-06 ENCOUNTER — Encounter: Payer: Self-pay | Admitting: Occupational Therapy

## 2015-12-08 ENCOUNTER — Ambulatory Visit: Payer: Self-pay | Admitting: Occupational Therapy

## 2015-12-13 ENCOUNTER — Encounter: Payer: Self-pay | Admitting: Occupational Therapy

## 2015-12-15 ENCOUNTER — Ambulatory Visit: Payer: Self-pay | Attending: Orthopedic Surgery | Admitting: Occupational Therapy

## 2015-12-15 DIAGNOSIS — R29898 Other symptoms and signs involving the musculoskeletal system: Secondary | ICD-10-CM | POA: Insufficient documentation

## 2015-12-15 DIAGNOSIS — M25641 Stiffness of right hand, not elsewhere classified: Secondary | ICD-10-CM | POA: Insufficient documentation

## 2015-12-15 DIAGNOSIS — M79641 Pain in right hand: Secondary | ICD-10-CM | POA: Insufficient documentation

## 2015-12-15 NOTE — Therapy (Signed)
[facility] [address] [place], [place], [address] Phone: [phone]   Fax:  [phone]  Occupational Therapy Treatment  Patient Details  Name: Kathy Willis MRN: [id number] Date of Birth: 10/25/1980 Referring Provider: Dr. Amanda PeaGramig  Encounter Date: 12/15/2015    No past medical history on file.  Past Surgical History  Procedure Laterality Date  . Tendon repair Right 11/05/2015    Procedure: TENDON REPAIR RIGHT SMALL FINGER;  Surgeon: Dominica SeverinWilliam Gramig, MD;  Location: [facility] OR;  Service: Orthopedics;  Laterality: Right;    There were no vitals filed for this visit.      Subjective Assessment - 12/15/15 1424    Subjective  Pt reports MD told her she could exercise out side of splint   Limitations per Modified [place] protocol - issued week 4 1/2 exercises as pt missed last visit, pt is 51/2 weeks post op   Patient Stated Goals to return to work   Currently in Pain? Yes   Pain Score 4   to 10/10 with execise   Pain Orientation Right   Pain Descriptors / Indicators Aching   Pain Type Surgical pain   Pain Onset 1 to 4 weeks ago   Pain Frequency Intermittent   Aggravating Factors  rubbing/ exercising   Pain Relieving Factors elevation/ meds         Ultrasound 3mhz, 0.8 w/cm 2 205 x 8 mins to palm and ring/ small finger of right hand. Pt is hypersensitive at 5th digit. Pt was instructed to perform desensitization / massage to 5th digit. Pt was instructed in updated HEP 4 1/2 weeks from [place] protocol. Pt required significant encouragement Place and hold exercises for composite finger flexion NMES 50 pps, 250 pw, 10 secs cycle intensity 7 to finger flexors with pt performing flexion during on cycle. No adverse reactions. Therapist added new strapipng to splint while pt performing.                      OT Education - 12/15/15 1429    Education provided Yes   Education Details updated HEP for A/ROM  out of splint(4 1/2 weeks post op per [place] protocol), instructed pt to rmove splint during daytime unless out in public and to avoid lifting or using RUE)   Person(s) Educated Patient;Other (comment)  significant other   Methods Explanation;Demonstration;Handout   Comprehension Verbalized understanding;Returned demonstration          OT Short Term Goals - 12/15/15 1348    OT SHORT TERM GOAL #1   Title I with HEP.   Time 6   Period Weeks   Status Achieved   OT SHORT TERM GOAL #2   Title I with splint wear, care and precautions.   Time 6   Period Weeks   Status Achieved   OT SHORT TERM GOAL #3   Title Pt will perform HEP with pain no greater than 3/10.   Time 6   Period Weeks   Status On-going  8-10/10           OT Long Term Goals - 11/09/15 1708    OT LONG TERM GOAL #1   Title I with updated HEP.   Time 12   Period Weeks   Status New   OT LONG TERM GOAL #2   Title Pt will demonstrate 95% composite finger flexion for increased functional use.   Time 12   Period Weeks   Status New   OT  LONG TERM GOAL #3   Title Pt will demonstrate 30 lbs of grip strength in right hand for increased functional use.   Time 12   Period Weeks   Status New   OT LONG TERM GOAL #4   Title Pt will resume use of RUE as dominant hand at least 95% of the time with pain less than or equal to 3/10.   Time 12   Period Weeks   Status New               Plan - 12/15/15 1426    Clinical Impression Statement Pt is technically 5/1/2 weeks postop yet she has not been seen since 31/2 weeks. Therapsit issued 4 1/2 weeks exercises out of the splint from [place] protocol. Pt was instructed to remove splint duirng the  day unless out in the public and not to lift or grip with right hand.   Rehab Potential Poor   OT Frequency 1x / week   OT Duration 12 weeks   OT Treatment/Interventions Self-care/ADL training;Therapeutic exercise;Patient/family education;Splinting;Neuromuscular  education;Ultrasound;Therapeutic exercises;Therapeutic activities;DME and/or AE instruction;Parrafin;Cryotherapy;Electrical Stimulation;Fluidtherapy;Scar mobilization;Passive range of motion;Contrast Bath;Moist Heat   Plan continue ultrasound, consider paraffin, progress HEP to 5 1/2 weeks post op   OT Home Exercise Plan issued P/ROM flexion with active ext in splint issued per MD hospital notes.   Consulted and Agree with Plan of Care Patient;Family member/caregiver      Patient will benefit from skilled therapeutic intervention in order to improve the following deficits and impairments:  Decreased coordination, Decreased range of motion, Impaired flexibility, Improper body mechanics, Decreased safety awareness, Increased edema, Impaired sensation, Decreased knowledge of precautions, Decreased activity tolerance, Decreased skin integrity, Decreased scar mobility, Impaired UE functional use, Pain, Decreased strength  Visit Diagnosis: Pain in right hand  Stiffness of right hand, not elsewhere classified  Other symptoms and signs involving the musculoskeletal system    Problem List There are no active problems to display for this patient.   RINE,KATHRYN 12/15/2015, 2:31 PM  [facility] [address] [place], [place], [address] Phone: [phone]   Fax:  [phone]  Name: Kathy Willis MRN: [id number] Date of Birth: April 06, 1981

## 2015-12-20 ENCOUNTER — Encounter: Payer: Self-pay | Admitting: Occupational Therapy

## 2015-12-22 ENCOUNTER — Ambulatory Visit: Payer: Self-pay | Admitting: Occupational Therapy

## 2015-12-22 DIAGNOSIS — R29898 Other symptoms and signs involving the musculoskeletal system: Secondary | ICD-10-CM

## 2015-12-22 DIAGNOSIS — M25641 Stiffness of right hand, not elsewhere classified: Secondary | ICD-10-CM

## 2015-12-22 DIAGNOSIS — M79641 Pain in right hand: Secondary | ICD-10-CM

## 2015-12-22 NOTE — Patient Instructions (Signed)
   PIP Flexion (Passive)   Use other hand to bend the middle joint of __small____ finger down as far as possible then straighten, keep big knuckle slightly bent.. Hold _10___ seconds. Repeat __5__ times. Do _4-6___ sessions per day.

## 2015-12-22 NOTE — Therapy (Signed)
[facility] [address] [place], [place], [address] Phone: [phone]   Fax:  [phone]  Occupational Therapy Treatment  Patient Details  Name: Kathy Willis [place] MRN: [id number] Date of Birth: 03/03/1981 Referring Provider: Dr. Amanda PeaGramig  Encounter Date: 12/22/2015      OT End of Session - 12/22/15 1158    Visit Number 5   Number of Visits 25   Date for OT Re-Evaluation 02/09/16   Authorization Type self pay   OT Start Time 0852   OT Stop Time 0934   OT Time Calculation (min) 42 min   Activity Tolerance Patient tolerated treatment well   Behavior During Therapy [facility] for tasks assessed/performed      No past medical history on file.  Past Surgical History  Procedure Laterality Date  . Tendon repair Right 11/05/2015    Procedure: TENDON REPAIR RIGHT SMALL FINGER;  Surgeon: Dominica SeverinWilliam Gramig, MD;  Location: [facility] OR;  Service: Orthopedics;  Laterality: Right;    There were no vitals filed for this visit.      Subjective Assessment - 12/22/15 0939    Limitations per Modified [place] protocol -    Patient Stated Goals to return to work   Currently in Pain? Yes   Pain Score 3    Pain Location Finger (Comment which one)   Pain Orientation Right   Pain Descriptors / Indicators Aching   Pain Type Surgical pain   Pain Onset 1 to 4 weeks ago   Pain Frequency Intermittent         [facility] [address] [place], [place], [address] Phone: [phone]   Fax:  [phone]  Occupational Therapy Treatment  Patient Details  Name: Kathy Willis [place] MRN: [id number] Date of Birth: 03/17/1981 Referring Provider: Dr. Amanda PeaGramig  Encounter Date: 12/22/2015      OT End of Session - 12/22/15 1158    Visit Number 5   Number of Visits 25   Date for OT Re-Evaluation 02/09/16   Authorization Type self pay   OT Start Time 0852   OT Stop Time 0934   OT Time  Calculation (min) 42 min   Activity Tolerance Patient tolerated treatment well   Behavior During Therapy [facility] for tasks assessed/performed      No past medical history on file.  Past Surgical History  Procedure Laterality Date  . Tendon repair Right 11/05/2015    Procedure: TENDON REPAIR RIGHT SMALL FINGER;  Surgeon: Dominica SeverinWilliam Gramig, MD;  Location[facility] OR;  Service: Orthopedics;  Laterality: Right;    There were no vitals filed for this visit.      Subjective Assessment - 12/22/15 0939    Limitations per Modified [place] protocol -    Patient Stated Goals to return to work   Currently in Pain? Yes   Pain Score 3    Pain Location Finger (Comment which one)   Pain Orientation Right   Pain Descriptors / Indicators Aching   Pain Type Surgical pain   Pain Onset 1 to 4 weeks ago   Pain Frequency Intermittent         Ultrasound 3mhz, 0.8 w/cm 2 205 x 8 mins to palm and ring/ small finger of right hand. Pt is hypersensitive at 5th digit. Pt was instructed to perform desensitization / massage to 5th digit. Paraffin x 9 mins to right hand for stiffness and pain relief. Pt was instructed in updated HEP 5 1/2 weeks from [place] protocol,  pt returned demonstration.. Therapist also issued P/ROM to PIP joint with MP in flexion due to stiffness. Pt returned demonstration.Place and hold exercises for composite finger flexion Buddy strap issued for use on ring and small finger for protection and increased AA/ROM                       OT Short Term Goals - 12/15/15 1348    OT SHORT TERM GOAL #1   Title I with HEP.   Time 6   Period Weeks   Status Achieved   OT SHORT TERM GOAL #2   Title I with splint wear, care and precautions.   Time 6   Period Weeks   Status Achieved   OT SHORT TERM GOAL #3   Title Pt will perform HEP with pain no greater than 3/10.   Time 6   Period Weeks   Status On-going  8-10/10           OT Long Term Goals - 11/09/15 1708    OT LONG  TERM GOAL #1   Title I with updated HEP.   Time 12   Period Weeks   Status New   OT LONG TERM GOAL #2   Title Pt will demonstrate 95% composite finger flexion for increased functional use.   Time 12   Period Weeks   Status New   OT LONG TERM GOAL #3   Title Pt will demonstrate 30 lbs of grip strength in right hand for increased functional use.   Time 12   Period Weeks   Status New   OT LONG TERM GOAL #4   Title Pt will resume use of RUE as dominant hand at least 95% of the time with pain less than or equal to 3/10.   Time 12   Period Weeks   Status New               Plan - 12/22/15 1153    Clinical Impression Statement Pt is progressing towards goals. Pt's splint has been Willis/c except for when she is out in busy environments.   Rehab Potential Fair   OT Frequency 1x / week   OT Duration 12 weeks   OT Treatment/Interventions Self-care/ADL training;Therapeutic exercise;Patient/family education;Splinting;Neuromuscular education;Ultrasound;Therapeutic exercises;Therapeutic activities;DME and/or AE instruction;Parrafin;Cryotherapy;Electrical Stimulation;Fluidtherapy;Scar mobilization;Passive range of motion;Contrast Bath;Moist Heat   Plan ultrasound, paraffin, continue to progress HEP   OT Home Exercise Plan issued P/ROM flexion with active ext in splint issued per MD hospital notes. Issued 5 1/2 weeks HEP from [place] protocol and P/ROM to PIP joint  with slight MP flexion   Consulted and Agree with Plan of Care Patient;Family member/caregiver      Patient will benefit from skilled therapeutic intervention in order to improve the following deficits and impairments:  Decreased coordination, Decreased range of motion, Impaired flexibility, Improper body mechanics, Decreased safety awareness, Increased edema, Impaired sensation, Decreased knowledge of precautions, Decreased activity tolerance, Decreased skin integrity, Decreased scar mobility, Impaired UE functional use, Pain,  Decreased strength  Visit Diagnosis: Pain in right hand  Stiffness of right hand, not elsewhere classified  Other symptoms and signs involving the musculoskeletal system    Problem List There are no active problems to display for this patient.   Lexys Milliner 12/22/2015, 12:05 PM  [facility] [address] [place], [place], [address] Phone: [phone]   Fax:  [phone]  Name: SHAVAUGHN SEIDL MRN: [id number] Date of Birth: 31-Dec-1980  OT Short Term Goals - 12/15/15 1348    OT SHORT TERM GOAL #1   Title I with HEP.   Time 6   Period Weeks   Status Achieved   OT SHORT TERM GOAL #2   Title I with splint wear, care and precautions.   Time 6   Period Weeks   Status Achieved   OT SHORT TERM GOAL #3   Title Pt will perform HEP with pain no greater than 3/10.   Time 6   Period Weeks   Status On-going  8-10/10           OT Long Term Goals - 11/09/15 1708    OT LONG TERM GOAL #1   Title I with updated HEP.   Time 12   Period Weeks   Status New   OT LONG TERM GOAL #2   Title Pt will demonstrate 95% composite finger flexion for increased functional use.   Time 12   Period Weeks   Status New   OT LONG TERM GOAL #3   Title Pt will demonstrate 30 lbs of grip strength in right hand for increased functional use.   Time 12   Period Weeks   Status New   OT LONG TERM GOAL #4   Title Pt will resume use of RUE as dominant hand at least 95% of the time with pain less than or equal to 3/10.   Time 12   Period Weeks   Status New               Plan - 12/22/15 1153    Clinical Impression Statement Pt is progressing towards goals. Pt's splint has been Willis/c except for when she is out in busy environments.   Rehab Potential Fair   OT Frequency 1x / week   OT Duration 12 weeks   OT Treatment/Interventions Self-care/ADL training;Therapeutic  exercise;Patient/family education;Splinting;Neuromuscular education;Ultrasound;Therapeutic exercises;Therapeutic activities;DME and/or AE instruction;Parrafin;Cryotherapy;Electrical Stimulation;Fluidtherapy;Scar mobilization;Passive range of motion;Contrast Bath;Moist Heat   Plan ultrasound, paraffin, continue to progress HEP   OT Home Exercise Plan issued P/ROM flexion with active ext in splint issued per MD hospital notes. Issued 5 1/2 weeks HEP from [place] protocol and P/ROM to PIP joint  with slight MP flexion   Consulted and Agree with Plan of Care Patient;Family member/caregiver      Patient will benefit from skilled therapeutic intervention in order to improve the following deficits and impairments:  Decreased coordination, Decreased range of motion, Impaired flexibility, Improper body mechanics, Decreased safety awareness, Increased edema, Impaired sensation, Decreased knowledge of precautions, Decreased activity tolerance, Decreased skin integrity, Decreased scar mobility, Impaired UE functional use, Pain, Decreased strength  Visit Diagnosis: Pain in right hand  Stiffness of right hand, not elsewhere classified  Other symptoms and signs involving the musculoskeletal system    Problem List There are no active problems to display for this patient.   Dierdre Mccalip 12/22/2015, 12:04 PM Keene Breath, OTR/L [phone] Phone: [phone] 12:04 PM 12/22/2015 [facility] [address] [place], [place], [address] Phone: [phone]   Fax:  [phone]  Name: SHELBA SUSI MRN: [id number] Date of Birth: [birth year]

## 2015-12-27 ENCOUNTER — Encounter: Payer: Self-pay | Admitting: Occupational Therapy

## 2015-12-29 ENCOUNTER — Ambulatory Visit: Payer: Self-pay | Admitting: Occupational Therapy

## 2016-01-03 ENCOUNTER — Encounter: Payer: Self-pay | Admitting: Occupational Therapy

## 2016-01-05 ENCOUNTER — Encounter: Payer: Self-pay | Admitting: Occupational Therapy

## 2016-01-05 ENCOUNTER — Ambulatory Visit: Payer: Self-pay | Admitting: Occupational Therapy

## 2016-01-05 DIAGNOSIS — R29898 Other symptoms and signs involving the musculoskeletal system: Secondary | ICD-10-CM

## 2016-01-05 DIAGNOSIS — M25641 Stiffness of right hand, not elsewhere classified: Secondary | ICD-10-CM

## 2016-01-05 DIAGNOSIS — M79641 Pain in right hand: Secondary | ICD-10-CM

## 2016-01-05 NOTE — Patient Instructions (Signed)
   Do all exercises that MD showed you 6x/day, 20 reps each. Make sure you go through entire range of motion and hold position  Begin with active ex's, then progress to place and hold ex's (*BE AGGRESSIVE)   1. Full fist - then push further with other hand, then take hand away and hold position 2. Blocking big knuckle straight, bend middle and tip joint of little finger, then push further down with other hand, then take hand away and hold position.  3. Straighten middle joint of Rt small finger with other hand as much as possible and hold 10 sec. Repeat 10 times  1. Grip Strengthening (Resistive Putty)   Squeeze putty using thumb and all fingers. Repeat _20___ times. Do __3__ sessions per day.   2. Roll putty into tube on table and pinch between thumb and ring/small finger x 20 reps, 3 sessions per day.     3. IP Fisting (Resistive Putty)    Keeping big knuckles straight, bend fingertips to squeeze putty. Repeat _20___ times. Do __3__ sessions per day.

## 2016-01-05 NOTE — Therapy (Signed)
[facility] [address] [place], [place], [address] Phone: [phone]   Fax:  [phone]  Occupational Therapy Treatment  Patient Details  Name: Kathy Willis MRN: [id number] Date of Birth: 1981-03-15 Referring Provider: Dr. Amanda Pea  Encounter Date: 01/05/2016      OT End of Session - 01/05/16 1420    Visit Number 6   Number of Visits 12   Date for OT Re-Evaluation 02/09/16   Authorization Type self pay   OT Start Time 1315   OT Stop Time 1400   OT Time Calculation (min) 45 min   Activity Tolerance Patient tolerated treatment well      History reviewed. No pertinent past medical history.  Past Surgical History  Procedure Laterality Date  . Tendon repair Right 11/05/2015    Procedure: TENDON REPAIR RIGHT SMALL FINGER;  Surgeon: Dominica Severin, MD;  Location: [facility] OR;  Service: Orthopedics;  Laterality: Right;    There were no vitals filed for this visit.      Subjective Assessment - 01/05/16 1323    Subjective  I saw Arlys John today. He said if I don't get it moving, I may have to another surgery   Pertinent History s/p Zone II flexor tendon repair Rt small finger 11/05/15   Limitations per Modified [place] protocol -    Patient Stated Goals to return to work   Currently in Pain? Yes   Pain Score 10-Worst pain ever  with P/ROM   Pain Location Finger (Comment which one)  SMALL FINGER   Pain Orientation Right   Pain Descriptors / Indicators Aching   Pain Type Surgical pain   Pain Onset More than a month ago   Pain Frequency Intermittent   Aggravating Factors  Bending   Pain Relieving Factors rest            [facility] OT Assessment - 01/05/16 0001    Right Hand AROM   R Little PIP 0-100 75 Degrees  ext = -45*   Hand Function   Right Hand Grip (lbs) 34 lbs   Left Hand Grip (lbs) 75 lbs                  OT Treatments/Exercises (OP) - 01/05/16 0001    Hand Exercises   Other Hand Exercises Pt  now over 8 weeks post-op. Pt missed last week of therapy due to work and reports she has not been doing her ex's like she should. Today's session was focused on aggressive A/ROM, P/ROM, place and hold ex's both compositely and with isolated blocking ex's for Rt small finger IP flexion and PIP extension.    Other Hand Exercises Theraputty HEP issued: composite flexion, IP flexion (fisting), and pinch strength x 10-20 reps each. (Issued yellow putty d/t red putty unavailable)                OT Education - 01/05/16 1351    Education provided Yes   Education Details Aggressive A/ROM and place and hold ex's, putty HEP    Person(s) Educated Patient   Methods Explanation;Demonstration;Handout   Comprehension Verbalized understanding;Returned demonstration          OT Short Term Goals - 12/15/15 1348    OT SHORT TERM GOAL #1   Title I with HEP.   Time 6   Period Weeks   Status Achieved   OT SHORT TERM GOAL #2   Title I with splint wear, care and precautions.   Time 6  Period Weeks   Status Achieved   OT SHORT TERM GOAL #3   Title Pt will perform HEP with pain no greater than 3/10.   Time 6   Period Weeks   Status On-going  8-10/10           OT Long Term Goals - 11/09/15 1708    OT LONG TERM GOAL #1   Title I with updated HEP.   Time 12   Period Weeks   Status New   OT LONG TERM GOAL #2   Title Pt will demonstrate 95% composite finger flexion for increased functional use.   Time 12   Period Weeks   Status New   OT LONG TERM GOAL #3   Title Pt will demonstrate 30 lbs of grip strength in right hand for increased functional use.   Time 12   Period Weeks   Status New   OT LONG TERM GOAL #4   Title Pt will resume use of RUE as dominant hand at least 95% of the time with pain less than or equal to 3/10.   Time 12   Period Weeks   Status New               Plan - 01/05/16 1421    Clinical Impression Statement Pt has had increased stiffness since last  seen. Pt now with PIP extensor lag of -45* and no true active DIP motion.    Rehab Potential Fair   OT Frequency 1x / week   OT Duration 12 weeks   OT Treatment/Interventions Self-care/ADL training;Therapeutic exercise;Patient/family education;Splinting;Neuromuscular education;Ultrasound;Therapeutic exercises;Therapeutic activities;DME and/or AE instruction;Parrafin;Cryotherapy;Electrical Stimulation;Fluidtherapy;Scar mobilization;Passive range of motion;Contrast Bath;Moist Heat   Plan review ex's last issued, issue pen rolling ex (with highlighter), assess ROM again, consider pm extension splint for PIP joint, continue modalities   Consulted and Agree with Plan of Care Patient      Patient will benefit from skilled therapeutic intervention in order to improve the following deficits and impairments:  Decreased coordination, Decreased range of motion, Impaired flexibility, Improper body mechanics, Decreased safety awareness, Increased edema, Impaired sensation, Decreased knowledge of precautions, Decreased activity tolerance, Decreased skin integrity, Decreased scar mobility, Impaired UE functional use, Pain, Decreased strength  Visit Diagnosis: Pain in right hand  Stiffness of right hand, not elsewhere classified  Other symptoms and signs involving the musculoskeletal system  Stiffness of joint, hand, right    Problem List There are no active problems to display for this patient.   Kelli ChurnBallie, Icess Bertoni Johnson, OTR/L 01/05/2016, 2:23 PM  [facility] [address] [place], [place], [address] Phone: [phone]   Fax:  [phone]  Name: Kathy Willis MRN: [id number] Date of Birth: 08/08/1981

## 2016-01-18 ENCOUNTER — Ambulatory Visit: Payer: Self-pay | Attending: Orthopedic Surgery | Admitting: Occupational Therapy

## 2016-01-18 DIAGNOSIS — M25641 Stiffness of right hand, not elsewhere classified: Secondary | ICD-10-CM | POA: Insufficient documentation

## 2016-01-18 DIAGNOSIS — M79641 Pain in right hand: Secondary | ICD-10-CM | POA: Insufficient documentation

## 2016-01-18 DIAGNOSIS — R29898 Other symptoms and signs involving the musculoskeletal system: Secondary | ICD-10-CM | POA: Insufficient documentation

## 2016-01-18 NOTE — Patient Instructions (Signed)
Your Splint This splint should initially be fitted by a healthcare practitioner.  The healthcare practitioner is responsible for providing wearing instructions and precautions to the patient, other healthcare practitioners and care provider involved in the patient's care.  This splint was custom made for you. Please read the following instructions to learn about wearing and caring for your splint.  Precautions Should your splint cause any of the following problems, remove the splint immediately and contact your therapist/physician.  Swelling  Severe Pain  Pressure Areas  Stiffness  Numbness  Do not wear your splint while operating machinery unless it has been fabricated for that purpose.  When To Wear Your Splint Where your splint according to your therapist/physician instructions. Nighttime only, remove if any problems  Care and Cleaning of Your Splint 1. Keep your splint away from open flames. 2. Your splint will lose its shape in temperatures over 135 degrees Farenheit, ( in car windows, near radiators, ovens or in hot water).  Never make any adjustments to your splint, if the splint needs adjusting remove it and make an appointment to see your therapist. 3. Your splint, including the cushion liner may be cleaned with soap and lukewarm water.  Do not immerse in hot water over 135 degrees Farenheit. 4. Straps may be washed with soap and water, but do not moisten the self-adhesive portion

## 2016-01-18 NOTE — Therapy (Signed)
[facility] [address] [place], [place], [address] Phone: [phone]   Fax:  [phone]  Occupational Therapy Treatment  Patient Details  Name: Kathy Willis MRN: [id number] Date of Birth: 02/13/1981 Referring Provider: Dr. Amanda PeaGramig  Encounter Date: 01/18/2016      OT End of Session - 01/18/16 1239    Visit Number 7   Number of Visits 12   Date for OT Re-Evaluation 02/09/16   Authorization Type self pay   OT Start Time 1019   OT Stop Time 1100   OT Time Calculation (min) 41 min      No past medical history on file.  Past Surgical History  Procedure Laterality Date  . Tendon repair Right 11/05/2015    Procedure: TENDON REPAIR RIGHT SMALL FINGER;  Surgeon: Dominica SeverinWilliam Gramig, MD;  Location: [facility] OR;  Service: Orthopedics;  Laterality: Right;    There were no vitals filed for this visit.             Ultrasound 3mhz, 0.8 w/cm 2, 50% x 8 mins to palm and ring/ small finger of right hand. Pt is less hypersensitive at 5th digit.    Therapist issued red putty and pt performed exercises for grip and pinch. Therapist fabricated nighttime extension splint for 5th digit and educated pt in wear, care and precautions. A/ROM composite flexion, IP flexion/ extension and place and hold exercises.             OT Education - 01/18/16 1233    Education provided Yes   Education Details nightime extension splint, importance of agressive ROM, red putty   Person(s) Educated Patient   Methods Explanation;Demonstration;Verbal cues;Handout   Comprehension Verbalized understanding;Returned demonstration          OT Short Term Goals - 01/18/16 1239    OT SHORT TERM GOAL #1   Title I with HEP.   Time 6   Period Weeks   Status Achieved   OT SHORT TERM GOAL #2   Title I with splint wear, care and precautions.   Time 6   Period Weeks   Status Achieved   OT SHORT TERM GOAL #3   Title Pt will perform HEP with  pain no greater than 3/10.   Time 6   Period Weeks   Status Achieved  8-10/10           OT Long Term Goals - 11/09/15 1708    OT LONG TERM GOAL #1   Title I with updated HEP.   Time 12   Period Weeks   Status New   OT LONG TERM GOAL #2   Title Pt will demonstrate 95% composite finger flexion for increased functional use.   Time 12   Period Weeks   Status New   OT LONG TERM GOAL #3   Title Pt will demonstrate 30 lbs of grip strength in right hand for increased functional use.   Time 12   Period Weeks   Status New   OT LONG TERM GOAL #4   Title Pt will resume use of RUE as dominant hand at least 95% of the time with pain less than or equal to 3/10.   Time 12   Period Weeks   Status New               Plan - 01/18/16 1237    Clinical Impression Statement Pt is progressing towards goals. Pt admits she has not been exercising as much as she  should have.   Rehab Potential Fair   OT Frequency 2x / week   OT Duration 12 weeks   OT Treatment/Interventions Self-care/ADL training;Therapeutic exercise;Patient/family education;Splinting;Neuromuscular education;Ultrasound;Therapeutic exercises;Therapeutic activities;DME and/or AE instruction;Parrafin;Cryotherapy;Electrical Stimulation;Fluidtherapy;Scar mobilization;Passive range of motion;Contrast Bath;Moist Heat   Plan splint check, ROM, strength   OT Home Exercise Plan issued P/ROM flexion with active ext in splint issued per MD hospital notes. Issued 5 1/2 weeks HEP from Duran protocol and P/ROM to PIP joint  with slight MP flexion, night time ext. splint   Consulted and Agree with Plan of Care Patient      Patient will benefit from skilled therapeutic intervention in order to improve the following deficits and impairments:  Decreased coordination, Decreased range of motion, Impaired flexibility, Improper body mechanics, Decreased safety awareness, Increased edema, Impaired sensation, Decreased knowledge of precautions,  Decreased activity tolerance, Decreased skin integrity, Decreased scar mobility, Impaired UE functional use, Pain, Decreased strength  Visit Diagnosis: Pain in right hand  Stiffness of right hand, not elsewhere classified  Other symptoms and signs involving the musculoskeletal system    Problem List There are no active problems to display for this patient.   Alejos Reinhardt 01/18/2016, 12:39 PM  [facility] [address] [place], [place], [address] Phone: [phone]   Fax:  [phone]  Name: Kathy Willis MRN: [id number] Date of Birth: 1981-03-10

## 2016-01-26 ENCOUNTER — Ambulatory Visit: Payer: Self-pay | Admitting: Occupational Therapy

## 2016-02-07 ENCOUNTER — Ambulatory Visit: Payer: Self-pay | Admitting: Occupational Therapy

## 2016-02-08 ENCOUNTER — Telehealth: Payer: Self-pay | Admitting: Occupational Therapy

## 2016-02-08 NOTE — Telephone Encounter (Signed)
Therapist left pt a message stating that she had missed he last 2 appointments and that if she did not show for her appointment on 02/16/16 she would be d/c from therapy

## 2016-02-16 ENCOUNTER — Ambulatory Visit: Payer: Self-pay | Attending: Orthopedic Surgery | Admitting: Occupational Therapy

## 2016-02-23 ENCOUNTER — Encounter: Payer: Self-pay | Admitting: Occupational Therapy

## 2016-06-30 ENCOUNTER — Encounter (HOSPITAL_COMMUNITY): Payer: Self-pay | Admitting: Emergency Medicine

## 2016-06-30 ENCOUNTER — Emergency Department (HOSPITAL_COMMUNITY)
Admission: EM | Admit: 2016-06-30 | Discharge: 2016-06-30 | Disposition: A | Discharge status: Home / Self Care | Payer: No Typology Code available for payment source | Attending: Physician Assistant | Admitting: Physician Assistant

## 2016-06-30 DIAGNOSIS — M542 Cervicalgia: Secondary | ICD-10-CM | POA: Diagnosis not present

## 2016-06-30 DIAGNOSIS — F17200 Nicotine dependence, unspecified, uncomplicated: Secondary | ICD-10-CM | POA: Insufficient documentation

## 2016-06-30 DIAGNOSIS — Y939 Activity, unspecified: Secondary | ICD-10-CM | POA: Insufficient documentation

## 2016-06-30 DIAGNOSIS — Y999 Unspecified external cause status: Secondary | ICD-10-CM | POA: Diagnosis not present

## 2016-06-30 DIAGNOSIS — S299XXA Unspecified injury of thorax, initial encounter: Secondary | ICD-10-CM | POA: Diagnosis present

## 2016-06-30 DIAGNOSIS — Y92410 Unspecified street and highway as the place of occurrence of the external cause: Secondary | ICD-10-CM | POA: Insufficient documentation

## 2016-06-30 DIAGNOSIS — M549 Dorsalgia, unspecified: Secondary | ICD-10-CM

## 2016-06-30 MED ORDER — CYCLOBENZAPRINE HCL 10 MG PO TABS: 10.0000 mg | ORAL_TABLET | Freq: Two times a day (BID) | ORAL | 0 refills | Status: DC | PRN

## 2016-06-30 MED ORDER — CYCLOBENZAPRINE HCL 10 MG PO TABS
10.0000 mg | ORAL_TABLET | Freq: Once | ORAL | Status: AC
Start: 2016-06-30 — End: 2016-06-30
  Administered 2016-06-30: 10 mg via ORAL
  Filled 2016-06-30: qty 1

## 2016-06-30 MED ORDER — IBUPROFEN 400 MG PO TABS
600.0000 mg | ORAL_TABLET | Freq: Once | ORAL | Status: DC
  Filled 2016-06-30: qty 1

## 2016-06-30 MED ORDER — CYCLOBENZAPRINE HCL 5 MG PO TABS: 7.5000 mg | ORAL_TABLET | Freq: Once | ORAL | Status: DC

## 2016-06-30 NOTE — ED Notes (Signed)
Declined W/C at D/C and was escorted to lobby by RN. 
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
## 2016-06-30 NOTE — Discharge Instructions (Signed)
Take the flexeril as prescribed and do not drive or operate machinery while on this medication. Take up to 600mg  ibuprofen every 6 hours as needed for pain. Eat with this medication as it can be hard on your stomach. [facility] days if symptoms are not improving. Return immediately to the ER if you experience worsening pain, headache, numbness, weakness, you pass out, abdominal pain, vomiting or any other concerning symptoms.
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
## 2017-05-13 ENCOUNTER — Inpatient Hospital Stay (HOSPITAL_COMMUNITY)
Admission: AD | Admit: 2017-05-13 | Discharge: 2017-05-13 | Discharge status: Against Medical Advice | Payer: Self-pay | Source: Ambulatory Visit | Attending: Obstetrics and Gynecology | Admitting: Obstetrics and Gynecology
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
## 2017-05-13 NOTE — MAU Note (Signed)
Not in lobby

## 2017-05-13 NOTE — MAU Note (Signed)
#  2 not in lobby 

## 2017-07-09 ENCOUNTER — Encounter (HOSPITAL_COMMUNITY): Payer: Self-pay | Admitting: Emergency Medicine

## 2017-07-09 ENCOUNTER — Emergency Department (HOSPITAL_COMMUNITY): Payer: Self-pay

## 2017-07-09 ENCOUNTER — Emergency Department (HOSPITAL_COMMUNITY)
Admission: EM | Admit: 2017-07-09 | Discharge: 2017-07-09 | Disposition: A | Discharge status: Home / Self Care | Payer: Self-pay | Attending: Emergency Medicine | Admitting: Emergency Medicine

## 2017-07-09 DIAGNOSIS — F17210 Nicotine dependence, cigarettes, uncomplicated: Secondary | ICD-10-CM | POA: Insufficient documentation

## 2017-07-09 DIAGNOSIS — R0789 Other chest pain: Secondary | ICD-10-CM | POA: Insufficient documentation

## 2017-07-09 DIAGNOSIS — Z79899 Other long term (current) drug therapy: Secondary | ICD-10-CM | POA: Insufficient documentation

## 2017-07-09 MED ORDER — KETOROLAC TROMETHAMINE 30 MG/ML IJ SOLN
30.0000 mg | Freq: Once | INTRAMUSCULAR | Status: AC
  Administered 2017-07-09: 30 mg via INTRAMUSCULAR
  Filled 2017-07-09: qty 1

## 2017-07-09 MED ORDER — IBUPROFEN 600 MG PO TABS: 600.0000 mg | ORAL_TABLET | Freq: Four times a day (QID) | ORAL | 0 refills | Status: DC | PRN
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
## 2017-07-09 NOTE — Discharge Instructions (Signed)
Your x-ray does not show broken ribs. You likely have bruised ribs and muscles. This can take a few weeks to gradually get better Continue heat packs. Take anti-inflammatory medications for pain. Return for worsening symptoms, including passing out, difficulty breathing, fever, or any other symptoms concerning to you
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
## 2018-02-19 ENCOUNTER — Inpatient Hospital Stay (HOSPITAL_COMMUNITY)
Admission: AD | Admit: 2018-02-19 | Discharge: 2018-02-19 | Discharge status: Against Medical Advice | Payer: Self-pay | Source: Ambulatory Visit | Attending: Obstetrics and Gynecology | Admitting: Obstetrics and Gynecology

## 2018-02-19 NOTE — MAU Note (Signed)
Pt not in lobby x2 

## 2018-02-19 NOTE — MAU Note (Signed)
Pt not in lobby.  

## 2018-02-19 NOTE — MAU Note (Signed)
Pt not in lobby x3 

## 2018-08-04 ENCOUNTER — Emergency Department (HOSPITAL_COMMUNITY): Admission: EM | Admit: 2018-08-04 | Discharge: 2018-08-04 | Discharge status: Against Medical Advice | Payer: Self-pay

## 2018-08-04 NOTE — ED Notes (Signed)
Patient called x 4  No answer.

## 2018-08-04 NOTE — ED Notes (Addendum)
Patient called for triage. Patient stated, "i'll be right back I have to move my car."

## 2018-08-04 NOTE — ED Notes (Signed)
Called for patient in lobby X2.

## 2018-10-10 ENCOUNTER — Other Ambulatory Visit: Payer: Self-pay

## 2018-10-10 ENCOUNTER — Emergency Department (HOSPITAL_COMMUNITY): Payer: Self-pay

## 2018-10-10 ENCOUNTER — Inpatient Hospital Stay (HOSPITAL_COMMUNITY)
Admission: EM | Admit: 2018-10-10 | Discharge: 2018-10-13 | DRG: 872 | Disposition: A | Discharge status: Home / Self Care | Payer: Self-pay | Attending: Student | Admitting: Student

## 2018-10-10 ENCOUNTER — Encounter (HOSPITAL_COMMUNITY): Payer: Self-pay | Admitting: *Deleted

## 2018-10-10 DIAGNOSIS — N3000 Acute cystitis without hematuria: Secondary | ICD-10-CM | POA: Diagnosis present

## 2018-10-10 DIAGNOSIS — N920 Excessive and frequent menstruation with regular cycle: Secondary | ICD-10-CM | POA: Diagnosis present

## 2018-10-10 DIAGNOSIS — F1290 Cannabis use, unspecified, uncomplicated: Secondary | ICD-10-CM | POA: Diagnosis present

## 2018-10-10 DIAGNOSIS — N10 Acute tubulo-interstitial nephritis: Secondary | ICD-10-CM | POA: Diagnosis present

## 2018-10-10 DIAGNOSIS — E860 Dehydration: Secondary | ICD-10-CM | POA: Diagnosis present

## 2018-10-10 DIAGNOSIS — E876 Hypokalemia: Secondary | ICD-10-CM | POA: Diagnosis present

## 2018-10-10 DIAGNOSIS — B370 Candidal stomatitis: Secondary | ICD-10-CM | POA: Diagnosis present

## 2018-10-10 DIAGNOSIS — F4321 Adjustment disorder with depressed mood: Secondary | ICD-10-CM | POA: Diagnosis present

## 2018-10-10 DIAGNOSIS — F1010 Alcohol abuse, uncomplicated: Secondary | ICD-10-CM | POA: Diagnosis present

## 2018-10-10 DIAGNOSIS — Z79891 Long term (current) use of opiate analgesic: Secondary | ICD-10-CM

## 2018-10-10 DIAGNOSIS — F17210 Nicotine dependence, cigarettes, uncomplicated: Secondary | ICD-10-CM | POA: Diagnosis present

## 2018-10-10 DIAGNOSIS — A419 Sepsis, unspecified organism: Principal | ICD-10-CM | POA: Diagnosis present

## 2018-10-10 DIAGNOSIS — F1490 Cocaine use, unspecified, uncomplicated: Secondary | ICD-10-CM | POA: Diagnosis present

## 2018-10-10 DIAGNOSIS — D649 Anemia, unspecified: Secondary | ICD-10-CM | POA: Diagnosis present

## 2018-10-10 DIAGNOSIS — D509 Iron deficiency anemia, unspecified: Secondary | ICD-10-CM | POA: Diagnosis present

## 2018-10-10 DIAGNOSIS — K5900 Constipation, unspecified: Secondary | ICD-10-CM | POA: Diagnosis present

## 2018-10-10 DIAGNOSIS — F322 Major depressive disorder, single episode, severe without psychotic features: Secondary | ICD-10-CM

## 2018-10-10 DIAGNOSIS — Z791 Long term (current) use of non-steroidal anti-inflammatories (NSAID): Secondary | ICD-10-CM

## 2018-10-10 DIAGNOSIS — B9689 Other specified bacterial agents as the cause of diseases classified elsewhere: Secondary | ICD-10-CM | POA: Diagnosis present

## 2018-10-10 DIAGNOSIS — Z681 Body mass index (BMI) 19 or less, adult: Secondary | ICD-10-CM

## 2018-10-10 DIAGNOSIS — R634 Abnormal weight loss: Secondary | ICD-10-CM | POA: Diagnosis present

## 2018-10-10 DIAGNOSIS — N12 Tubulo-interstitial nephritis, not specified as acute or chronic: Secondary | ICD-10-CM

## 2018-10-10 DIAGNOSIS — F329 Major depressive disorder, single episode, unspecified: Secondary | ICD-10-CM | POA: Diagnosis present

## 2018-10-10 DIAGNOSIS — R630 Anorexia: Secondary | ICD-10-CM | POA: Diagnosis present

## 2018-10-10 DIAGNOSIS — N760 Acute vaginitis: Secondary | ICD-10-CM | POA: Diagnosis present

## 2018-10-10 DIAGNOSIS — Z79899 Other long term (current) drug therapy: Secondary | ICD-10-CM

## 2018-10-10 DIAGNOSIS — F32A Depression, unspecified: Secondary | ICD-10-CM | POA: Diagnosis present

## 2018-10-10 HISTORY — DX: Acute pyelonephritis: N10

## 2018-10-10 LAB — COMPREHENSIVE METABOLIC PANEL
ALT: 20 U/L (ref 0–44)
ANION GAP: 8 (ref 5–15)
AST: 22 U/L (ref 15–41)
Albumin: 3.6 g/dL (ref 3.5–5.0)
Alkaline Phosphatase: 90 U/L (ref 38–126)
BILIRUBIN TOTAL: 0.5 mg/dL (ref 0.3–1.2)
BUN: 6 mg/dL (ref 6–20)
CHLORIDE: 101 mmol/L (ref 98–111)
CO2: 25 mmol/L (ref 22–32)
Calcium: 8.6 mg/dL — ABNORMAL LOW (ref 8.9–10.3)
Creatinine, Ser: 0.71 mg/dL (ref 0.44–1.00)
GFR calc Af Amer: >60 mL/min (ref >60)
GFR calc non Af Amer: >60 mL/min (ref >60)
GLUCOSE: 117 mg/dL — AB (ref 70–99)
POTASSIUM: 2.7 mmol/L — AB (ref 3.5–5.1)
SODIUM: 134 mmol/L — AB (ref 135–145)
TOTAL PROTEIN: 7.5 g/dL (ref 6.5–8.1)

## 2018-10-10 LAB — I-STAT BETA HCG BLOOD, ED (MC, WL, AP ONLY)

## 2018-10-10 LAB — URINALYSIS, ROUTINE W REFLEX MICROSCOPIC
BILIRUBIN URINE: NEGATIVE
Glucose, UA: NEGATIVE mg/dL
KETONES UR: NEGATIVE mg/dL
Nitrite: POSITIVE — AB
Protein, ur: 30 mg/dL — AB
SPECIFIC GRAVITY, URINE: 1.015 (ref 1.005–1.030)
pH: 6 (ref 5.0–8.0)

## 2018-10-10 LAB — RETICULOCYTES
Immature Retic Fract: 10.9 % (ref 2.3–15.9)
RBC.: 3.35 MIL/uL — ABNORMAL LOW (ref 3.87–5.11)
RETIC COUNT ABSOLUTE: 17.4 10*3/uL — AB (ref 19.0–186.0)
Retic Ct Pct: 0.5 % (ref 0.4–3.1)

## 2018-10-10 LAB — WET PREP, GENITAL
SPERM: NONE SEEN
Trich, Wet Prep: NONE SEEN
Yeast Wet Prep HPF POC: NONE SEEN

## 2018-10-10 LAB — IRON AND TIBC
Iron: 9 ug/dL — ABNORMAL LOW (ref 28–170)
Saturation Ratios: 3 % — ABNORMAL LOW (ref 10.4–31.8)
TIBC: 300 ug/dL (ref 250–450)
UIBC: 291 ug/dL

## 2018-10-10 LAB — VITAMIN B12: VITAMIN B 12: 154 pg/mL — AB (ref 180–914)

## 2018-10-10 LAB — CBC
HCT: 33.2 % — ABNORMAL LOW (ref 36.0–46.0)
Hemoglobin: 9.9 g/dL — ABNORMAL LOW (ref 12.0–15.0)
MCH: 23.5 pg — AB (ref 26.0–34.0)
MCHC: 29.8 g/dL — AB (ref 30.0–36.0)
MCV: 78.9 fL — ABNORMAL LOW (ref 80.0–100.0)
Platelets: 450 10*3/uL — ABNORMAL HIGH (ref 150–400)
RBC: 4.21 MIL/uL (ref 3.87–5.11)
RDW: 18.3 % — AB (ref 11.5–15.5)
WBC: 15.4 10*3/uL — ABNORMAL HIGH (ref 4.0–10.5)
nRBC: 0 % (ref 0.0–0.2)

## 2018-10-10 LAB — PROTIME-INR
INR: 1.2 (ref 0.8–1.2)
Prothrombin Time: 15.2 seconds (ref 11.4–15.2)

## 2018-10-10 LAB — MAGNESIUM: Magnesium: 1.6 mg/dL — ABNORMAL LOW (ref 1.7–2.4)

## 2018-10-10 LAB — FOLATE: Folate: 9.7 ng/mL (ref >5.9)

## 2018-10-10 LAB — RAPID HIV SCREEN (HIV 1/2 AB+AG)
HIV 1/2 ANTIBODIES: NONREACTIVE
HIV-1 P24 Antigen - HIV24: NONREACTIVE

## 2018-10-10 LAB — PHOSPHORUS: Phosphorus: 2.2 mg/dL — ABNORMAL LOW (ref 2.5–4.6)

## 2018-10-10 LAB — APTT: aPTT: 38 seconds — ABNORMAL HIGH (ref 24–36)

## 2018-10-10 LAB — FERRITIN: Ferritin: 13 ng/mL (ref 11–307)

## 2018-10-10 MED ORDER — SODIUM CHLORIDE 0.9 % IV BOLUS
1000.0000 mL | Freq: Once | INTRAVENOUS | Status: AC
  Administered 2018-10-10: 1000 mL via INTRAVENOUS

## 2018-10-10 MED ORDER — METRONIDAZOLE 500 MG PO TABS
500.0000 mg | ORAL_TABLET | Freq: Once | ORAL | Status: AC
  Administered 2018-10-10: 500 mg via ORAL
  Filled 2018-10-10: qty 1

## 2018-10-10 MED ORDER — HYDROMORPHONE HCL 1 MG/ML IJ SOLN
0.5000 mg | Freq: Once | INTRAMUSCULAR | Status: AC
  Administered 2018-10-10: 0.5 mg via INTRAVENOUS
  Filled 2018-10-10: qty 1

## 2018-10-10 MED ORDER — HYDROCODONE-ACETAMINOPHEN 5-325 MG PO TABS
1.0000 | ORAL_TABLET | Freq: Four times a day (QID) | ORAL | Status: DC | PRN
  Administered 2018-10-10 – 2018-10-12 (×8): 2 via ORAL
  Administered 2018-10-13: 1 via ORAL
  Filled 2018-10-10 (×4): qty 2
  Filled 2018-10-10: qty 1
  Filled 2018-10-10 (×5): qty 2

## 2018-10-10 MED ORDER — POTASSIUM CHLORIDE CRYS ER 20 MEQ PO TBCR
40.0000 meq | EXTENDED_RELEASE_TABLET | Freq: Once | ORAL | Status: AC
  Administered 2018-10-10: 40 meq via ORAL
  Filled 2018-10-10: qty 2

## 2018-10-10 MED ORDER — SODIUM CHLORIDE 0.9 % IV SOLN
1.0000 g | Freq: Once | INTRAVENOUS | Status: AC
  Administered 2018-10-10: 1 g via INTRAVENOUS
  Filled 2018-10-10: qty 10

## 2018-10-10 MED ORDER — NYSTATIN 100000 UNIT/ML MT SUSP
5.0000 mL | Freq: Four times a day (QID) | OROMUCOSAL | Status: DC
  Administered 2018-10-11 – 2018-10-13 (×10): 500000 [IU] via OROMUCOSAL
  Filled 2018-10-10 (×9): qty 5

## 2018-10-10 MED ORDER — OXYCODONE HCL 5 MG PO TABS: 5.0000 mg | ORAL_TABLET | Freq: Once | ORAL | Status: DC

## 2018-10-10 MED ORDER — POTASSIUM CHLORIDE 10 MEQ/100ML IV SOLN
10.0000 meq | INTRAVENOUS | Status: AC
  Administered 2018-10-10 – 2018-10-11 (×4): 10 meq via INTRAVENOUS
  Filled 2018-10-10 (×4): qty 100

## 2018-10-10 MED ORDER — SODIUM CHLORIDE 0.9 % IV SOLN
1.0000 g | INTRAVENOUS | Status: DC
  Administered 2018-10-11 – 2018-10-12 (×2): 1 g via INTRAVENOUS
  Filled 2018-10-10 (×2): qty 1

## 2018-10-10 MED ORDER — ACETAMINOPHEN 325 MG PO TABS
650.0000 mg | ORAL_TABLET | Freq: Four times a day (QID) | ORAL | Status: DC | PRN
  Administered 2018-10-10 – 2018-10-11 (×2): 650 mg via ORAL
  Filled 2018-10-10 (×2): qty 2

## 2018-10-10 MED ORDER — ACETAMINOPHEN 500 MG PO TABS
1000.0000 mg | ORAL_TABLET | Freq: Once | ORAL | Status: AC
  Administered 2018-10-10: 1000 mg via ORAL
  Filled 2018-10-10: qty 2

## 2018-10-10 NOTE — ED Provider Notes (Signed)
[facility] Emergency Department Provider Note MRN:  [id number]  Arrival date & time: 10/10/18     Chief Complaint   Flank Pain   History of Present Illness   Kathy Willis is a 38 y.o. year-old female with no pertinent past medical history presenting to the ED with chief complaint of flank pain.  Sudden onset flank pain 2 days ago, with radiation to the right lower quadrant.  Associated with pain with urination, straining with urination.  Cloudy urine.  Also endorsing vaginal discharge.  States she thinks she might have an STD.  Also think she might have oral thrush.  Denies headache or vision change, no chest pain or shortness of breath.  Symptoms are constant, no exacerbating or relieving factors.  Review of Systems  A complete 10 system review of systems was obtained and all systems are negative except as noted in the HPI and PMH.   Patient's Health History    Past Medical History:  Diagnosis Date  . Acute pyelonephritis 10/10/2018    Past Surgical History:  Procedure Laterality Date  . TENDON REPAIR Right 11/05/2015   Procedure: TENDON REPAIR RIGHT SMALL FINGER;  Surgeon: Dominica Severin, MD;  Location: [facility] OR;  Service: Orthopedics;  Laterality: Right;    No family history on file.  Social History   Socioeconomic History  . Marital status: Legally Separated    Spouse name: Not on file  . Number of children: Not on file  . Years of education: Not on file  . Highest education level: Not on file  Occupational History  . Not on file  Social Needs  . Financial resource strain: Not on file  . Food insecurity:    Worry: Not on file    Inability: Not on file  . Transportation needs:    Medical: Not on file    Non-medical: Not on file  Tobacco Use  . Smoking status: Current Every Day Smoker    Packs/day: 0.50  . Smokeless tobacco: Never Used  Substance and Sexual Activity  . Alcohol use: Yes    Comment: occasion  . Drug use: Yes    Types: Marijuana   . Sexual activity: Yes  Lifestyle  . Physical activity:    Days per week: Not on file    Minutes per session: Not on file  . Stress: Not on file  Relationships  . Social connections:    Talks on phone: Not on file    Gets together: Not on file    Attends religious service: Not on file    Active member of club or organization: Not on file    Attends meetings of clubs or organizations: Not on file    Relationship status: Not on file  . Intimate partner violence:    Fear of current or ex partner: Not on file    Emotionally abused: Not on file    Physically abused: Not on file    Forced sexual activity: Not on file  Other Topics Concern  . Not on file  Social History Narrative  . Not on file     Physical Exam  Vital Signs and Nursing Notes reviewed Vitals:   10/10/18 2220 10/10/18 2240  BP: 102/62 101/88  Pulse: 75 80  Resp:    Temp:    SpO2: 100% 99%    CONSTITUTIONAL: Well-appearing, NAD NEURO:  Alert and oriented x 3, no focal deficits EYES:  eyes equal and reactive ENT/NECK:  no LAD, no JVD; white  discoloration to the tongue CARDIO: Regular rate, well-perfused, normal S1 and S2 PULM:  CTAB no wheezing or rhonchi GI/GU:  normal bowel sounds, non-distended, non-tender; mild right CVA tenderness MSK/SPINE:  No gross deformities, no edema SKIN:  no rash, atraumatic PSYCH:  Appropriate speech and behavior  Diagnostic and Interventional Summary    Labs Reviewed  WET PREP, GENITAL - Abnormal; Notable for the following components:      Result Value   Clue Cells Wet Prep HPF POC PRESENT (*)    WBC, Wet Prep HPF POC MANY (*)    All other components within normal limits  URINALYSIS, ROUTINE W REFLEX MICROSCOPIC - Abnormal; Notable for the following components:   APPearance CLOUDY (*)    Hgb urine dipstick MODERATE (*)    Protein, ur 30 (*)    Nitrite POSITIVE (*)    Leukocytes,Ua LARGE (*)    WBC, UA >50 (*)    Bacteria, UA RARE (*)    All other components within  normal limits  CBC - Abnormal; Notable for the following components:   WBC 15.4 (*)    Hemoglobin 9.9 (*)    HCT 33.2 (*)    MCV 78.9 (*)    MCH 23.5 (*)    MCHC 29.8 (*)    RDW 18.3 (*)    Platelets 450 (*)    All other components within normal limits  COMPREHENSIVE METABOLIC PANEL - Abnormal; Notable for the following components:   Sodium 134 (*)    Potassium 2.7 (*)    Glucose, Bld 117 (*)    Calcium 8.6 (*)    All other components within normal limits  RETICULOCYTES - Abnormal; Notable for the following components:   RBC. 3.35 (*)    Retic Count, Absolute 17.4 (*)    All other components within normal limits  CULTURE, BLOOD (SINGLE)  RAPID HIV SCREEN (HIV 1/2 AB+AG)  MAGNESIUM  PHOSPHORUS  RPR  PREALBUMIN  VITAMIN B12  FOLATE  IRON AND TIBC  FERRITIN  I-STAT BETA HCG BLOOD, ED ([facility], [facility], [facility] ONLY)  GC/CHLAMYDIA PROBE AMP ([place]) NOT AT [facility]    CT RENAL STONE STUDY  Final Result      Medications  potassium chloride 10 mEq in 100 mL IVPB (10 mEq Intravenous New Bag/Given 10/10/18 2230)  cefTRIAXone (ROCEPHIN) 1 g in sodium chloride 0.9 % 100 mL IVPB (0 g Intravenous Stopped 10/10/18 1955)  HYDROmorphone (DILAUDID) injection 0.5 mg (0.5 mg Intravenous Given 10/10/18 1922)  potassium chloride SA (K-DUR,KLOR-CON) CR tablet 40 mEq (40 mEq Oral Given 10/10/18 2030)  sodium chloride 0.9 % bolus 1,000 mL (1,000 mLs Intravenous New Bag/Given 10/10/18 2122)  sodium chloride 0.9 % bolus 1,000 mL (0 mLs Intravenous Stopped 10/10/18 2236)  acetaminophen (TYLENOL) tablet 1,000 mg (1,000 mg Oral Given 10/10/18 2118)  metroNIDAZOLE (FLAGYL) tablet 500 mg (500 mg Oral Given 10/10/18 2118)     Procedures Critical Care  ED Course and Medical Decision Making  I have reviewed the triage vital signs and the nursing notes.  Pertinent labs & imaging results that were available during my care of the patient were reviewed by me and considered in my medical decision making (see below for  details).  Suspect UTI/pyelonephritis in this 38 year old female with dysuria, flank pain.  Pain began suddenly, raising concern for concomitant stone, will CT to exclude.  Patient also with vaginal discharge, pelvic exam and swabs pending.  Given oral thrush, will also screen for HIV.  CT without evidence of  stone, air-fluid levels which are nonspecific.  Clinically this is most consistent with pyelonephritis given the nitrite positive urine and flank pain.  Given IV ceftriaxone, IV fluids.  Patient also has bacterial vaginosis, provided with oral metronidazole.  Potassium 2.7, patient still symptomatic and febrile, admitted to hospital service for further care.  Elmer Sow. Pilar Plate, MD [facility] Health Emergency Medicine [email or web address]  Final Clinical Impressions(s) / ED Diagnoses     ICD-10-CM   1. Pyelonephritis N12   2. Bacterial vaginosis N76.0    B96.89   3. Hypokalemia E87.6     ED Discharge Orders    None         Sabas Sous, MD 10/10/18 2248

## 2018-10-10 NOTE — ED Notes (Signed)
Pt unable to provide urine in triage

## 2018-10-10 NOTE — ED Notes (Signed)
ED TO INPATIENT HANDOFF REPORT  Name/Age/Gender Kathy Willis 38 y.o. female  Code Status Code Status History    Date Active Date Inactive Code Status Order ID Comments User Context   03/19/2014 0824 03/19/2014 1453 Full Code [id number]  Ward, Layla Maw, DO ED      Home/SNF/Other Home  Chief Complaint SOB/Sever Trouble Breathing  Level of Care/Admitting Diagnosis ED Disposition    ED Disposition Condition Comment   Admit  Hospital Area: [facility] [[id number]]  Level of Care: Telemetry [5]  Admit to tele based on following criteria: Other see comments  Comments: hypokalemia  Diagnosis: Sepsis [facility]) [[id number]]  Admitting Physician: Therisa Doyne [[id number]]  Attending Physician: Therisa Doyne [[id number]]  PT Class (Do Not Modify): Observation [104]  PT Acc Code (Do Not Modify): Observation [10022]       Medical History Past Medical History:  Diagnosis Date  . Acute pyelonephritis 10/10/2018    Allergies Allergies  Allergen Reactions  . Shellfish Allergy Anaphylaxis    IV Location/Drains/Wounds Patient Lines/Drains/Airways Status   Active Line/Drains/Airways    Name:   Placement date:   Placement time:   Site:   Days:   Peripheral IV 10/10/18 Right Antecubital   10/10/18    1919    Antecubital   less than 1   Incision (Closed) 11/05/15 Hand Right   11/05/15    1139     1070          Labs/Imaging Results for orders placed or performed during the hospital encounter of 10/10/18 (from the past 48 hour(s))  Urinalysis, Routine w reflex microscopic- may I&O cath if menses     Status: Abnormal   Collection Time: 10/10/18  6:33 PM  Result Value Ref Range   Color, Urine YELLOW YELLOW   APPearance CLOUDY (A) CLEAR   Specific Gravity, Urine 1.015 1.005 - 1.030   pH 6.0 5.0 - 8.0   Glucose, UA NEGATIVE NEGATIVE mg/dL   Hgb urine dipstick MODERATE (A) NEGATIVE   Bilirubin Urine NEGATIVE NEGATIVE   Ketones, ur NEGATIVE NEGATIVE mg/dL   Protein, ur  30 (A) NEGATIVE mg/dL   Nitrite POSITIVE (A) NEGATIVE   Leukocytes,Ua LARGE (A) NEGATIVE   RBC / HPF 6-10 0 - 5 RBC/hpf   WBC, UA >50 (H) 0 - 5 WBC/hpf   Bacteria, UA RARE (A) NONE SEEN   Squamous Epithelial / LPF 0-5 0 - 5   WBC Clumps PRESENT    Mucus PRESENT     Comment: Performed at [facility], [address] [place], [place] [address]  CBC     Status: Abnormal   Collection Time: 10/10/18  6:33 PM  Result Value Ref Range   WBC 15.4 (H) 4.0 - 10.5 K/uL   RBC 4.21 3.87 - 5.11 MIL/uL   Hemoglobin 9.9 (L) 12.0 - 15.0 g/dL   HCT 84.1 (L) 66.0 - 63.0 %   MCV 78.9 (L) 80.0 - 100.0 fL   MCH 23.5 (L) 26.0 - 34.0 pg   MCHC 29.8 (L) 30.0 - 36.0 g/dL   RDW 16.0 (H) 10.9 - 32.3 %   Platelets 450 (H) 150 - 400 K/uL   nRBC 0.0 0.0 - 0.2 %    Comment: Performed at [facility], [address] [place], [place] [address]  Comprehensive metabolic panel     Status: Abnormal   Collection Time: 10/10/18  6:33 PM  Result Value Ref Range   Sodium 134 (L) 135 -  145 mmol/L   Potassium 2.7 (LL) 3.5 - 5.1 mmol/L    Comment: CRITICAL RESULT CALLED TO, READ BACK BY AND VERIFIED WITH: TALKINGTON,J RN  ON 10/10/2018 JACKSON,K     Chloride 101 98 - 111 mmol/L   CO2 25 22 - 32 mmol/L   Glucose, Bld 117 (H) 70 - 99 mg/dL   BUN 6 6 - 20 mg/dL   Creatinine, Ser 4.09 0.44 - 1.00 mg/dL   Calcium 8.6 (L) 8.9 - 10.3 mg/dL   Total Protein 7.5 6.5 - 8.1 g/dL   Albumin 3.6 3.5 - 5.0 g/dL   AST 22 15 - 41 U/L   ALT 20 0 - 44 U/L   Alkaline Phosphatase 90 38 - 126 U/L   Total Bilirubin 0.5 0.3 - 1.2 mg/dL   GFR calc non Af Amer >60 >60 mL/min   GFR calc Af Amer >60 >60 mL/min   Anion gap 8 5 - 15    Comment: Performed at [facility], [address] [place], [place] [address]  Rapid HIV screen (HIV 1/2 Ab+Ag)     Status: None   Collection Time: 10/10/18  6:33 PM  Result Value Ref Range   HIV-1 P24 Antigen - HIV24 NON REACTIVE NON REACTIVE    HIV 1/2 Antibodies NON REACTIVE NON REACTIVE   Interpretation (HIV Ag Ab)      A non reactive test result means that HIV 1 or HIV 2 antibodies and HIV 1 p24 antigen were not detected in the specimen.    Comment: RESULT CALLED TO, READ BACK BY AND VERIFIED WITH: TALKINGTON,J RN  ON 10/10/2018 JACKSON,K Performed at [facility], [address], [place], [place] [address]   I-Stat beta hCG blood, ED     Status: None   Collection Time: 10/10/18  6:43 PM  Result Value Ref Range   I-stat hCG, quantitative <5.0 <5 mIU/mL   Comment 3            Comment:   GEST. AGE      CONC.  (mIU/mL)   <=1 WEEK        5 - 50     2 WEEKS       50 - 500     3 WEEKS       100 - 10,000     4 WEEKS     1,000 - 30,000        FEMALE AND NON-PREGNANT FEMALE:     LESS THAN 5 mIU/mL   Wet prep, genital     Status: Abnormal   Collection Time: 10/10/18  7:15 PM  Result Value Ref Range   Yeast Wet Prep HPF POC NONE SEEN NONE SEEN   Trich, Wet Prep NONE SEEN NONE SEEN   Clue Cells Wet Prep HPF POC PRESENT (A) NONE SEEN   WBC, Wet Prep HPF POC MANY (A) NONE SEEN   Sperm NONE SEEN     Comment: Performed at [facility], [address], [place], [place] [address]  Reticulocytes     Status: Abnormal   Collection Time: 10/10/18 10:06 PM  Result Value Ref Range   Retic Ct Pct 0.5 0.4 - 3.1 %   RBC. 3.35 (L) 3.87 - 5.11 MIL/uL   Retic Count, Absolute 17.4 (L) 19.0 - 186.0 K/uL   Immature Retic Fract 10.9 2.3 - 15.9 %    Comment: Performed at [facility], [address], [place], [place] [address]  Ct Renal Stone Study  Result Date: 10/10/2018 CLINICAL DATA:  Right flank pain for 2 days EXAM: CT ABDOMEN AND PELVIS WITHOUT CONTRAST TECHNIQUE: Multidetector CT imaging of the abdomen and pelvis was performed following the standard protocol without IV contrast. COMPARISON:  None. FINDINGS: Lower chest: No acute abnormality. Hepatobiliary: No focal liver  abnormality is seen. No gallstones, gallbladder wall thickening, or biliary dilatation. Pancreas: Unremarkable. No pancreatic ductal dilatation or surrounding inflammatory changes. Spleen: Normal in size without focal abnormality. Adrenals/Urinary Tract: Adrenal glands are unremarkable. Kidneys are normal, without renal calculi, focal lesion, or hydronephrosis. Bladder is unremarkable. Stomach/Bowel: Stomach is within normal limits. Appendix appears normal. No evidence of bowel wall thickening, distention, or inflammatory changes. Air-fluid levels within the small bowel and colon as can be seen with enterocolitis. No pneumatosis, pneumoperitoneum or portal venous gas. Vascular/Lymphatic: No significant vascular findings are present. No enlarged abdominal or pelvic lymph nodes. Reproductive: Uterus and bilateral adnexa are unremarkable. Other: No abdominal wall hernia or abnormality. No abdominopelvic ascites. Musculoskeletal: No acute osseous abnormality. No aggressive osseous lesion. IMPRESSION: 1. Air-fluid levels within the small bowel and colon as can be seen with enterocolitis. Electronically Signed   By: Elige Ko   On: 10/10/2018 19:52    Pending Labs Unresulted Labs (From admission, onward)    Start     Ordered   10/11/18 0500  Prealbumin  Tomorrow morning,   R     10/10/18 2204   10/10/18 2206  Vitamin B12  (Anemia Panel (PNL))  Once,   R     10/10/18 2205   10/10/18 2206  Folate  (Anemia Panel (PNL))  Once,   R     10/10/18 2205   10/10/18 2206  Iron and TIBC  (Anemia Panel (PNL))  Once,   R     10/10/18 2205   10/10/18 2206  Ferritin  (Anemia Panel (PNL))  Once,   R     10/10/18 2205   10/10/18 2201  RPR  Add-on,   R     10/10/18 2200   10/10/18 2200  Magnesium  Add-on,   R     10/10/18 2159   10/10/18 2200  Phosphorus  Add-on,   R     10/10/18 2159   10/10/18 2154  Culture, blood (single)  ONCE - STAT,   STAT     10/10/18 2153   Unscheduled  Occult blood card to lab, stool RN  will collect  As needed,   R    Question:  Specimen to be collected by?  Answer:  RN will collect   10/10/18 2206   Signed and Held  Lactic acid, plasma  STAT Now then every 3 hours,   STAT     Signed and Held   Signed and Held  Procalcitonin  ONCE - STAT,   R     Signed and Held   Signed and Held  Protime-INR  ONCE - STAT,   R     Signed and Held   Signed and Held  APTT  ONCE - STAT,   R     Signed and Held          Vitals/Pain Today's Vitals   10/10/18 2208 10/10/18 2209 10/10/18 2210 10/10/18 2220  BP:  91/67 91/67 102/62  Pulse:  82 80 75  Resp:   15   Temp:   99 F (37.2 C)   TempSrc:   Oral   SpO2:  100% 100% 100%  PainSc:  5        Isolation Precautions No active isolations  Medications Medications  potassium chloride 10 mEq in 100 mL IVPB (10 mEq Intravenous New Bag/Given 10/10/18 2230)  cefTRIAXone (ROCEPHIN) 1 g in sodium chloride 0.9 % 100 mL IVPB (0 g Intravenous Stopped 10/10/18 1955)  HYDROmorphone (DILAUDID) injection 0.5 mg (0.5 mg Intravenous Given 10/10/18 1922)  potassium chloride SA (K-DUR,KLOR-CON) CR tablet 40 mEq (40 mEq Oral Given 10/10/18 2030)  sodium chloride 0.9 % bolus 1,000 mL (1,000 mLs Intravenous New Bag/Given 10/10/18 2122)  sodium chloride 0.9 % bolus 1,000 mL (0 mLs Intravenous Stopped 10/10/18 2236)  acetaminophen (TYLENOL) tablet 1,000 mg (1,000 mg Oral Given 10/10/18 2118)  metroNIDAZOLE (FLAGYL) tablet 500 mg (500 mg Oral Given 10/10/18 2118)    Mobility walks

## 2018-10-10 NOTE — ED Triage Notes (Signed)
Pt reports severe R flank pain x 2 days.  She started to have dysuria today but did not notice any hematuria.  She also endorses white discoloration on her tongue.  She woke up this morning soaked in sweat.  She also endorses pelvic pain.  Reports dizziness.

## 2018-10-10 NOTE — ED Notes (Signed)
PELVIC EXAM SET UP AT BEDSIDE.  

## 2018-10-10 NOTE — H&P (Signed)
Kathy Willis KHT:[id number] DOB: 1980/10/10 DOA: 10/10/2018     PCP: Patient, No Pcp Per   Outpatient Specialists:  NONE   Patient arrived to ER on 10/10/18 at 1515  Patient coming from: home Lives With family   Chief Complaint:  Chief Complaint  Patient presents with  . Flank Pain    HPI: Kathy Willis is a 38 y.o. female with medical history significant of anemia     Presented with   a history of severe right flank pain some associated dysuria started today pelvic pain lightheaded and night sweats The right lower quadrant of abdominal pain.  Pain with urination.  Urine has been cloudy.  Also she has been having some vaginal discharge as well. Concern that she developed thrush.  She smokes  She has been binge drinking on the weekend  Reports a lot of stress and not feeling happy Has had decreased PO intake and loosing weight She has been eating ice a lot. Reports heavy menses period last 4-5 days but sometimes heavy  Denies any blood in stool or black stools  States she has been feeling very down she has been sleeping most of the day stopped eating, very tearful feels very unhappy at this point denies suicidal ideations denies homicidal ideations denies being unsafe     While in ER: Right is was suspected at which point CT per renal protocol was ordered which showed air-fluid levels within small bowel and colon suspect for enterocolitis but no stones noted.  K 2.7 To be persistently febrile up to 101.5 tachycardic up to 111 with elevated white blood cell count meeting sepsis criteria  Started on antibiotics IV Rocephin unfortunately blood cultures were not obtained at that time  The following Work up has been ordered so far:  Orders Placed This Encounter  Procedures  . Wet prep, genital  . CT RENAL STONE STUDY  . Urinalysis, Routine w reflex microscopic- may I&O cath if menses  . CBC  . Comprehensive metabolic panel  . Rapid HIV screen (HIV 1/2 Ab+Ag)  .  Pelvic cart  . Consult to hospitalist  . I-Stat beta hCG blood, ED   Following Medications were ordered in ER: Medications  cefTRIAXone (ROCEPHIN) 1 g in sodium chloride 0.9 % 100 mL IVPB (0 g Intravenous Stopped 10/10/18 1955)  HYDROmorphone (DILAUDID) injection 0.5 mg (0.5 mg Intravenous Given 10/10/18 1922)  potassium chloride SA (K-DUR,KLOR-CON) CR tablet 40 mEq (40 mEq Oral Given 10/10/18 2030)  sodium chloride 0.9 % bolus 1,000 mL (1,000 mLs Intravenous New Bag/Given 10/10/18 2122)  sodium chloride 0.9 % bolus 1,000 mL (1,000 mLs Intravenous New Bag/Given 10/10/18 2120)  acetaminophen (TYLENOL) tablet 1,000 mg (1,000 mg Oral Given 10/10/18 2118)  metroNIDAZOLE (FLAGYL) tablet 500 mg (500 mg Oral Given 10/10/18 2118)    Significant initial  Findings: Abnormal Labs Reviewed  WET PREP, GENITAL - Abnormal; Notable for the following components:      Result Value   Clue Cells Wet Prep HPF POC PRESENT (*)    WBC, Wet Prep HPF POC MANY (*)    All other components within normal limits  URINALYSIS, ROUTINE W REFLEX MICROSCOPIC - Abnormal; Notable for the following components:   APPearance CLOUDY (*)    Hgb urine dipstick MODERATE (*)    Protein, ur 30 (*)    Nitrite POSITIVE (*)    Leukocytes,Ua LARGE (*)    WBC, UA >50 (*)    Bacteria, UA RARE (*)  All other components within normal limits  CBC - Abnormal; Notable for the following components:   WBC 15.4 (*)    Hemoglobin 9.9 (*)    HCT 33.2 (*)    MCV 78.9 (*)    MCH 23.5 (*)    MCHC 29.8 (*)    RDW 18.3 (*)    Platelets 450 (*)    All other components within normal limits  COMPREHENSIVE METABOLIC PANEL - Abnormal; Notable for the following components:   Sodium 134 (*)    Potassium 2.7 (*)    Glucose, Bld 117 (*)    Calcium 8.6 (*)    All other components within normal limits     Lactic Acid, Venous No results found for: LATICACIDVEN  Na 134 K 2.7  Cr  * stable,  Up from baseline see below Lab Results  Component  Value Date   CREATININE 0.71 10/10/2018   CREATININE 0.63 11/13/2014   CREATININE 0.67 03/19/2014   WBC  15.4   HG/HCT  Down from baseline see below    Component Value Date/Time   HGB 9.9 (L) 10/10/2018 1833   HCT 33.2 (L) 10/10/2018 1833   Troponin (Point of Care Test) No results for input(s): TROPIPOC in the last 72 hours.  BNP (last 3 results) No results for input(s): BNP in the last 8760 hours.  ProBNP (last 3 results) No results for input(s): PROBNP in the last 8760 hours.    UA  evidence of UTI      CTabd/pelvis -air-fluid levels possible enterocolitis  ECG: Not obtained   ED Triage Vitals  Enc Vitals Group     BP 10/10/18 1519 130/90     Pulse Rate 10/10/18 1519 (!) 111     Resp 10/10/18 1519 20     Temp 10/10/18 1519 98.5 F (36.9 C)     Temp Source 10/10/18 1519 Oral     SpO2 10/10/18 1519 100 %     Weight --      Height --      Head Circumference --      Peak Flow --      Pain Score 10/10/18 1522 10     Pain Loc --      Pain Edu? --      Excl. in [place]? --   TMAX(24)@       Latest  Blood pressure 112/74, pulse 92, temperature (!) 101.5 F (38.6 C), temperature source Oral, resp. rate 16, last menstrual period 10/03/2018, SpO2 100 %, unknown if currently breastfeeding.    Hospitalist was called for admission for pyelonephritis and sepsis   Review of Systems:    Pertinent positives include:  Fevers, chills, fatigue, no discharge and white discharge on tongue night sweats,weight loss   Constitutional:    HEENT:  No headaches, Difficulty swallowing,Tooth/dental problems,Sore throat,  No sneezing, itching, ear ache, nasal congestion, post nasal drip,  Cardio-vascular:  No chest pain, Orthopnea, PND, anasarca, dizziness, palpitations.no Bilateral lower extremity swelling  GI:  No heartburn, indigestion, abdominal pain, nausea, vomiting, diarrhea, change in bowel habits, loss of appetite, melena, blood in stool, hematemesis Resp:  no shortness of  breath at rest. No dyspnea on exertion, No excess mucus, no productive cough, No non-productive cough, No coughing up of blood.No change in color of mucus.No wheezing. Skin:  no rash or lesions. No jaundice GU:  no dysuria, change in color of urine, no urgency or frequency. No straining to urinate.  No flank pain.  Musculoskeletal:  No  joint pain or no joint swelling. No decreased range of motion. No back pain.  Psych:  No change in mood or affect. No depression or anxiety. No memory loss.  Neuro: no localizing neurological complaints, no tingling, no weakness, no double vision, no gait abnormality, no slurred speech, no confusion  All systems reviewed and apart from [place] all are negative  Past Medical History:  History reviewed. No pertinent past medical history.    Past Surgical History:  Procedure Laterality Date  . TENDON REPAIR Right 11/05/2015   Procedure: TENDON REPAIR RIGHT SMALL FINGER;  Surgeon: Roseanne Kaufman, MD;  Location: [facility];  Service: Orthopedics;  Laterality: Right;    Social History:  Ambulatory   Independently     reports that she has been smoking. She has been smoking about 0.50 packs per day. She has never used smokeless tobacco. She reports current alcohol use. She reports current drug use. Drug: Marijuana.     Family History:   Family History  Problem Relation Age of Onset  . Diabetes Neg Hx   . Hypertension Neg Hx   . Cancer Neg Hx     Allergies: Allergies  Allergen Reactions  . Shellfish Allergy Anaphylaxis     Prior to Admission medications   Medication Sig Start Date End Date Taking? Authorizing Provider  acetaminophen (TYLENOL) 325 MG tablet Take 650 mg by mouth every 6 (six) hours as needed for headache.   Yes [provider]  cephALEXin (KEFLEX) 500 MG capsule Take 1 capsule (500 mg total) by mouth 4 (four) times daily. 11/05/15   Avelina Laine, PA-C  cyclobenzaprine (FLEXERIL) 10 MG tablet Take 1 tablet (10 mg total) by  mouth 2 (two) times daily as needed for muscle spasms. 06/30/16   Focht, Fraser Din, PA  ibuprofen (ADVIL,MOTRIN) 600 MG tablet Take 1 tablet (600 mg total) by mouth every 6 (six) hours as needed for mild pain or moderate pain. 07/09/17   Forde Dandy, MD  ibuprofen (ADVIL,MOTRIN) 800 MG tablet Take 1 tablet (800 mg total) by mouth 3 (three) times daily. Patient not taking: Reported on 10/10/2018 11/13/14   Hyman Bible, PA-C  oxyCODONE (ROXICODONE) 5 MG immediate release tablet Take 1 tablet (5 mg total) by mouth every 4 (four) hours as needed for severe pain. Patient not taking: Reported on 10/10/2018 11/05/15   Avelina Laine, PA-C  traMADol (ULTRAM) 50 MG tablet Take 1 tablet (50 mg total) by mouth every 6 (six) hours as needed. Patient not taking: Reported on 01/07/7823 09/16/51   Delora Fuel, MD   Physical Exam: Blood pressure 112/74, pulse 92, temperature (!) 101.5 F (38.6 C), temperature source Oral, resp. rate 16, last menstrual period 10/03/2018, SpO2 100 %, unknown if currently breastfeeding. 1. General:  in  Acute distress tearful  Chronically ill  -appearing 2. Psychological: Alert and   Oriented 3. Head/ENT:     Dry Mucous Membranes                          Head Non traumatic, neck supple                        Poor Dentition                         Thrush  noted 4. SKIN:   decreased Skin turgor,  Skin clean Dry and intact no rash 5. Heart: Regular rate  and rhythm no  Murmur, no Rub or gallop 6. Lungs: Clear to auscultation bilaterally, no wheezes or crackles   7. Abdomen: Soft,  non-tender, Non distended  bowel sounds present 8. Lower extremities: no clubbing, cyanosis, no edema 9. Neurologically Grossly intact, moving all 4 extremities equally   10. MSK: Normal range of motion Given recent history of weight loss had a breaded breast exam done which showed no palpable masses  LABS:     Recent Labs  Lab 10/10/18 1833  WBC 15.4*  HGB 9.9*  HCT 33.2*  MCV 78.9*  PLT  626*   Basic Metabolic Panel: Recent Labs  Lab 10/10/18 1833  NA 134*  K 2.7*  CL 101  CO2 25  GLUCOSE 117*  BUN 6  CREATININE 0.71  CALCIUM 8.6*      Recent Labs  Lab 10/10/18 1833  AST 22  ALT 20  ALKPHOS 90  BILITOT 0.5  PROT 7.5  ALBUMIN 3.6   No results for input(s): LIPASE, AMYLASE in the last 168 hours. No results for input(s): AMMONIA in the last 168 hours.    HbA1C: No results for input(s): HGBA1C in the last 72 hours. CBG: No results for input(s): GLUCAP in the last 168 hours.    Urine analysis:    Component Value Date/Time   COLORURINE YELLOW 10/10/2018 1833   APPEARANCEUR CLOUDY (A) 10/10/2018 1833   LABSPEC 1.015 10/10/2018 1833   PHURINE 6.0 10/10/2018 1833   GLUCOSEU NEGATIVE 10/10/2018 1833   HGBUR MODERATE (A) 10/10/2018 1833   BILIRUBINUR NEGATIVE 10/10/2018 1833   KETONESUR NEGATIVE 10/10/2018 1833   PROTEINUR 30 (A) 10/10/2018 1833   UROBILINOGEN 0.2 05/12/2013 1810   NITRITE POSITIVE (A) 10/10/2018 1833   LEUKOCYTESUR LARGE (A) 10/10/2018 1833       Cultures:    Component Value Date/Time   SDES URINE, CLEAN CATCH 06/11/2011 1206   SPECREQUEST NONE 06/11/2011 1206   CULT CITROBACTER KOSERI 06/11/2011 1206   REPTSTATUS 06/14/2011 FINAL 06/11/2011 1206     Radiological Exams on Admission: Ct Renal Stone Study  Result Date: 10/10/2018 CLINICAL DATA:  Right flank pain for 2 days EXAM: CT ABDOMEN AND PELVIS WITHOUT CONTRAST TECHNIQUE: Multidetector CT imaging of the abdomen and pelvis was performed following the standard protocol without IV contrast. COMPARISON:  None. FINDINGS: Lower chest: No acute abnormality. Hepatobiliary: No focal liver abnormality is seen. No gallstones, gallbladder wall thickening, or biliary dilatation. Pancreas: Unremarkable. No pancreatic ductal dilatation or surrounding inflammatory changes. Spleen: Normal in size without focal abnormality. Adrenals/Urinary Tract: Adrenal glands are unremarkable. Kidneys  are normal, without renal calculi, focal lesion, or hydronephrosis. Bladder is unremarkable. Stomach/Bowel: Stomach is within normal limits. Appendix appears normal. No evidence of bowel wall thickening, distention, or inflammatory changes. Air-fluid levels within the small bowel and colon as can be seen with enterocolitis. No pneumatosis, pneumoperitoneum or portal venous gas. Vascular/Lymphatic: No significant vascular findings are present. No enlarged abdominal or pelvic lymph nodes. Reproductive: Uterus and bilateral adnexa are unremarkable. Other: No abdominal wall hernia or abnormality. No abdominopelvic ascites. Musculoskeletal: No acute osseous abnormality. No aggressive osseous lesion. IMPRESSION: 1. Air-fluid levels within the small bowel and colon as can be seen with enterocolitis. Electronically Signed   By: Kathreen Devoid   On: 10/10/2018 19:52    Chart has been reviewed    Assessment/Plan   38 y.o. female with medical history significant of anemia   Admitted for acute pyelonephritis [place]  Present on Admission: . Acute pyelonephritis -we  will treat with IV antibiotics (Rocephin) await results of urine and blood cultures. . Hypokalemia -replace and follow check magnesium level . Dehydration -we will rehydrate and follow fluid status . Thrush -patient states she sucks her thumb and frequently has this. HIV status negative.  Will order nystatin . Bacterial vaginosis -will need Flagyl 500 mg p.o.BID for the next 7 days . Sepsis ([place]) -  -SIRS criteria met with elevated white blood cell count,  tachycardia ,   fever.    without evidence of end organ damage  -Most likely source being urinary   - Obtain serial lactic acid and procalcitonin level.  - Initiate IV antibiotics   - await results of blood and urine culture  - Rehydrate aggressively  . Anemia -has heavy menstrual flow.  Will obtain anemia panel and treat as needed would probably benefit from follow-up with GYN check Hemoccult  stool as well . Depression order behavioral health consult to help initiate appropriate management and establish follow-up patient at this point denies suicidal ideation  Alcohol abuse patient reports binge drinking.  Order CIWA protocol and monitor spoke about importance of cutting down  Tobacco abuse will order tobacco cessation protocol   Weight loss and poor nutrition will order nutritional consult check prealbumin could be also secondary to psychiatric illness such as depression   Other plan as per orders.  DVT prophylaxis:  SCD    Code Status:  FULL CODE  as per patient  I had personally discussed CODE STATUS with patient   Family Communication:   Family not at  Bedside    Disposition Plan:      To home once workup is complete and patient is stable                                     Consults called: none  Admission status:     Obs    Level of care    tele  For 12H         Janeil Schexnayder 10/11/2018, 1:12 AM    [facility]     after 2 AM please page floor coverage PA If 7AM-7PM, please contact the day team taking care of the patient using [email or web address]

## 2018-10-11 ENCOUNTER — Encounter (HOSPITAL_COMMUNITY): Payer: Self-pay | Admitting: Internal Medicine

## 2018-10-11 DIAGNOSIS — F32A Depression, unspecified: Secondary | ICD-10-CM | POA: Diagnosis present

## 2018-10-11 DIAGNOSIS — F329 Major depressive disorder, single episode, unspecified: Secondary | ICD-10-CM | POA: Diagnosis present

## 2018-10-11 DIAGNOSIS — F1010 Alcohol abuse, uncomplicated: Secondary | ICD-10-CM | POA: Diagnosis present

## 2018-10-11 LAB — COMPREHENSIVE METABOLIC PANEL
ALT: 15 U/L (ref 0–44)
AST: 13 U/L — ABNORMAL LOW (ref 15–41)
Albumin: 3 g/dL — ABNORMAL LOW (ref 3.5–5.0)
Alkaline Phosphatase: 68 U/L (ref 38–126)
Anion gap: 5 (ref 5–15)
BUN: 7 mg/dL (ref 6–20)
CO2: 22 mmol/L (ref 22–32)
Calcium: 8.1 mg/dL — ABNORMAL LOW (ref 8.9–10.3)
Chloride: 112 mmol/L — ABNORMAL HIGH (ref 98–111)
Creatinine, Ser: 0.55 mg/dL (ref 0.44–1.00)
GFR calc Af Amer: >60 mL/min (ref >60)
GFR calc non Af Amer: >60 mL/min (ref >60)
Glucose, Bld: 99 mg/dL (ref 70–99)
Potassium: 3.4 mmol/L — ABNORMAL LOW (ref 3.5–5.1)
Sodium: 139 mmol/L (ref 135–145)
Total Bilirubin: 0.5 mg/dL (ref 0.3–1.2)
Total Protein: 5.9 g/dL — ABNORMAL LOW (ref 6.5–8.1)

## 2018-10-11 LAB — CBC
HCT: 28.4 % — ABNORMAL LOW (ref 36.0–46.0)
HEMOGLOBIN: 8.1 g/dL — AB (ref 12.0–15.0)
MCH: 23.1 pg — ABNORMAL LOW (ref 26.0–34.0)
MCHC: 28.5 g/dL — ABNORMAL LOW (ref 30.0–36.0)
MCV: 81.1 fL (ref 80.0–100.0)
Platelets: 367 10*3/uL (ref 150–400)
RBC: 3.5 MIL/uL — ABNORMAL LOW (ref 3.87–5.11)
RDW: 18.6 % — ABNORMAL HIGH (ref 11.5–15.5)
WBC: 12.1 10*3/uL — ABNORMAL HIGH (ref 4.0–10.5)
nRBC: 0 % (ref 0.0–0.2)

## 2018-10-11 LAB — LACTIC ACID, PLASMA
Lactic Acid, Venous: 0.6 mmol/L (ref 0.5–1.9)
Lactic Acid, Venous: 1 mmol/L (ref 0.5–1.9)

## 2018-10-11 LAB — PHOSPHORUS: Phosphorus: 2.4 mg/dL — ABNORMAL LOW (ref 2.5–4.6)

## 2018-10-11 LAB — TSH: TSH: 0.854 u[IU]/mL (ref 0.350–4.500)

## 2018-10-11 LAB — RAPID URINE DRUG SCREEN, HOSP PERFORMED
Amphetamines: NOT DETECTED
Barbiturates: NOT DETECTED
Benzodiazepines: NOT DETECTED
COCAINE: POSITIVE — AB
Opiates: NOT DETECTED
Tetrahydrocannabinol: POSITIVE — AB

## 2018-10-11 LAB — PREALBUMIN: Prealbumin: 8 mg/dL — ABNORMAL LOW (ref 18–38)

## 2018-10-11 LAB — RPR: RPR Ser Ql: NONREACTIVE

## 2018-10-11 LAB — PROCALCITONIN: Procalcitonin: 0.14 ng/mL

## 2018-10-11 LAB — MAGNESIUM: Magnesium: 2 mg/dL (ref 1.7–2.4)

## 2018-10-11 MED ORDER — LORAZEPAM 1 MG PO TABS
1.0000 mg | ORAL_TABLET | Freq: Four times a day (QID) | ORAL | Status: DC | PRN
  Administered 2018-10-12 (×2): 1 mg via ORAL
  Filled 2018-10-11 (×2): qty 1

## 2018-10-11 MED ORDER — ONDANSETRON HCL 4 MG PO TABS
4.0000 mg | ORAL_TABLET | Freq: Four times a day (QID) | ORAL | Status: DC | PRN
  Administered 2018-10-11 – 2018-10-13 (×3): 4 mg via ORAL
  Filled 2018-10-11 (×4): qty 1

## 2018-10-11 MED ORDER — SENNA 8.6 MG PO TABS
1.0000 | ORAL_TABLET | Freq: Two times a day (BID) | ORAL | Status: DC
  Administered 2018-10-11 – 2018-10-13 (×6): 8.6 mg via ORAL
  Filled 2018-10-11 (×6): qty 1

## 2018-10-11 MED ORDER — ADULT MULTIVITAMIN W/MINERALS CH
1.0000 | ORAL_TABLET | Freq: Every day | ORAL | Status: DC
  Administered 2018-10-11 – 2018-10-13 (×3): 1 via ORAL
  Filled 2018-10-11 (×3): qty 1

## 2018-10-11 MED ORDER — POLYETHYLENE GLYCOL 3350 17 G PO PACK: 17.0000 g | PACK | Freq: Every day | ORAL | Status: DC | PRN

## 2018-10-11 MED ORDER — SODIUM CHLORIDE 0.9 % IV SOLN
125.0000 mg | Freq: Every day | INTRAVENOUS | Status: AC
  Administered 2018-10-11 – 2018-10-13 (×3): 125 mg via INTRAVENOUS
  Filled 2018-10-11 (×3): qty 10

## 2018-10-11 MED ORDER — POTASSIUM CHLORIDE CRYS ER 20 MEQ PO TBCR
40.0000 meq | EXTENDED_RELEASE_TABLET | Freq: Once | ORAL | Status: AC
  Administered 2018-10-11: 40 meq via ORAL
  Filled 2018-10-11: qty 2

## 2018-10-11 MED ORDER — VITAMIN B-1 100 MG PO TABS
100.0000 mg | ORAL_TABLET | Freq: Every day | ORAL | Status: DC
  Administered 2018-10-11 – 2018-10-13 (×3): 100 mg via ORAL
  Filled 2018-10-11 (×3): qty 1

## 2018-10-11 MED ORDER — THIAMINE HCL 100 MG/ML IJ SOLN: 100.0000 mg | Freq: Every day | INTRAMUSCULAR | Status: DC

## 2018-10-11 MED ORDER — MIRTAZAPINE 15 MG PO TABS
7.5000 mg | ORAL_TABLET | Freq: Every day | ORAL | Status: DC
  Administered 2018-10-11 – 2018-10-12 (×2): 7.5 mg via ORAL
  Filled 2018-10-11 (×2): qty 1

## 2018-10-11 MED ORDER — SODIUM CHLORIDE 0.9 % IV SOLN
INTRAVENOUS | Status: AC
  Administered 2018-10-11 (×2): via INTRAVENOUS

## 2018-10-11 MED ORDER — LORAZEPAM 2 MG/ML IJ SOLN: 1.0000 mg | Freq: Four times a day (QID) | INTRAMUSCULAR | Status: DC | PRN

## 2018-10-11 MED ORDER — ONDANSETRON HCL 4 MG/2ML IJ SOLN
4.0000 mg | Freq: Four times a day (QID) | INTRAMUSCULAR | Status: DC | PRN
  Administered 2018-10-11 (×2): 4 mg via INTRAVENOUS
  Filled 2018-10-11 (×2): qty 2

## 2018-10-11 MED ORDER — METRONIDAZOLE 500 MG PO TABS
500.0000 mg | ORAL_TABLET | Freq: Two times a day (BID) | ORAL | Status: DC
  Administered 2018-10-11 – 2018-10-13 (×5): 500 mg via ORAL
  Filled 2018-10-11 (×5): qty 1

## 2018-10-11 MED ORDER — ENSURE ENLIVE PO LIQD
237.0000 mL | Freq: Two times a day (BID) | ORAL | Status: DC
  Administered 2018-10-11 – 2018-10-13 (×2): 237 mL via ORAL

## 2018-10-11 MED ORDER — FOLIC ACID 1 MG PO TABS
1.0000 mg | ORAL_TABLET | Freq: Every day | ORAL | Status: DC
  Administered 2018-10-11 – 2018-10-13 (×3): 1 mg via ORAL
  Filled 2018-10-11 (×3): qty 1

## 2018-10-11 NOTE — Consult Note (Signed)
[facility] Face-to-Face Psychiatry Consult   Reason for Consult:''severe depression'' Referring Physician:  Dr. [place] Patient Identification: Kathy Willis MRN:  [id number] Principal Diagnosis: Acute pyelonephritis Diagnosis:  Principal Problem:   Acute pyelonephritis Active Problems:   Hypokalemia   Dehydration   Thrush   Bacterial vaginosis   Sepsis (HCC)   Anemia   Depression   Alcohol abuse   Total Time spent with patient: 45 minutes  Subjective:   Kathy Willis is a 38 y.o. female patient admitted with right-sided flank.  HPI: Patient who denies prior history of mental illness but reports history of Anemia who was admitted with complaint of severe right sided flank pain associated with dysuria.Patient reports that she was doing well until about 2 weeks ago when she started feeling weak, having right lower quadrant abdominal pain with malodorous urine and has since being diagnosed with Kidney infection. Patient also reports that she has oral thrush, Vaginal discharge, losing weight due to poor appetite and feeling sad. She reports poor energy level, feeling hopeless but denies psychosis, delusions, suicidal ideation, intent or plan. She reports occasional cocaine and cannabis use. Urine toxicology is positive for cocaine and THC.  Past Psychiatric History: Denies  Risk to Self:  denies Risk to Others:  denies Prior Inpatient Therapy:  denies Prior Outpatient Therapy:  none  Past Medical History:  Past Medical History:  Diagnosis Date  . Acute pyelonephritis 10/10/2018    Past Surgical History:  Procedure Laterality Date  . TENDON REPAIR Right 11/05/2015   Procedure: TENDON REPAIR RIGHT SMALL FINGER;  Surgeon: Dominica Severin, MD;  Location: [facility] OR;  Service: Orthopedics;  Laterality: Right;   Family History:  Family History  Problem Relation Age of Onset  . Diabetes Neg Hx   . Hypertension Neg Hx   . Cancer Neg Hx    Family Psychiatric  History: Social History:   Social History   Substance and Sexual Activity  Alcohol Use Yes   Comment: occasion     Social History   Substance and Sexual Activity  Drug Use Yes  . Types: Marijuana    Social History   Socioeconomic History  . Marital status: Legally Separated    Spouse name: Not on file  . Number of children: Not on file  . Years of education: Not on file  . Highest education level: Not on file  Occupational History  . Not on file  Social Needs  . Financial resource strain: Not on file  . Food insecurity:    Worry: Not on file    Inability: Not on file  . Transportation needs:    Medical: Not on file    Non-medical: Not on file  Tobacco Use  . Smoking status: Current Every Day Smoker    Packs/day: 0.50  . Smokeless tobacco: Never Used  Substance and Sexual Activity  . Alcohol use: Yes    Comment: occasion  . Drug use: Yes    Types: Marijuana  . Sexual activity: Yes  Lifestyle  . Physical activity:    Days per week: Not on file    Minutes per session: Not on file  . Stress: Not on file  Relationships  . Social connections:    Talks on phone: Not on file    Gets together: Not on file    Attends religious service: Not on file    Active member of club or organization: Not on file    Attends meetings of clubs or organizations: Not on file  Relationship status: Not on file  Other Topics Concern  . Not on file  Social History Narrative  . Not on file   Additional Social History:    Allergies:   Allergies  Allergen Reactions  . Shellfish Allergy Anaphylaxis    Labs:  Results for orders placed or performed during the hospital encounter of 10/10/18 (from the past 48 hour(s))  Urinalysis, Routine w reflex microscopic- may I&O cath if menses     Status: Abnormal   Collection Time: 10/10/18  6:33 PM  Result Value Ref Range   Color, Urine YELLOW YELLOW   APPearance CLOUDY (A) CLEAR   Specific Gravity, Urine 1.015 1.005 - 1.030   pH 6.0 5.0 - 8.0   Glucose, UA  NEGATIVE NEGATIVE mg/dL   Hgb urine dipstick MODERATE (A) NEGATIVE   Bilirubin Urine NEGATIVE NEGATIVE   Ketones, ur NEGATIVE NEGATIVE mg/dL   Protein, ur 30 (A) NEGATIVE mg/dL   Nitrite POSITIVE (A) NEGATIVE   Leukocytes,Ua LARGE (A) NEGATIVE   RBC / HPF 6-10 0 - 5 RBC/hpf   WBC, UA >50 (H) 0 - 5 WBC/hpf   Bacteria, UA RARE (A) NONE SEEN   Squamous Epithelial / LPF 0-5 0 - 5   WBC Clumps PRESENT    Mucus PRESENT     Comment: Performed at [facility], [address], [place], [place] [address]  CBC     Status: Abnormal   Collection Time: 10/10/18  6:33 PM  Result Value Ref Range   WBC 15.4 (H) 4.0 - 10.5 K/uL   RBC 4.21 3.87 - 5.11 MIL/uL   Hemoglobin 9.9 (L) 12.0 - 15.0 g/dL   HCT 60.4 (L) 54.0 - 98.1 %   MCV 78.9 (L) 80.0 - 100.0 fL   MCH 23.5 (L) 26.0 - 34.0 pg   MCHC 29.8 (L) 30.0 - 36.0 g/dL   RDW 19.1 (H) 47.8 - 29.5 %   Platelets 450 (H) 150 - 400 K/uL   nRBC 0.0 0.0 - 0.2 %    Comment: Performed at [facility], [address], [place], [place] [address]  Comprehensive metabolic panel     Status: Abnormal   Collection Time: 10/10/18  6:33 PM  Result Value Ref Range   Sodium 134 (L) 135 - 145 mmol/L   Potassium 2.7 (LL) 3.5 - 5.1 mmol/L    Comment: CRITICAL RESULT CALLED TO, READ BACK BY AND VERIFIED WITH: TALKINGTON,J RN  ON 10/10/2018 JACKSON,K     Chloride 101 98 - 111 mmol/L   CO2 25 22 - 32 mmol/L   Glucose, Bld 117 (H) 70 - 99 mg/dL   BUN 6 6 - 20 mg/dL   Creatinine, Ser 8.65 0.44 - 1.00 mg/dL   Calcium 8.6 (L) 8.9 - 10.3 mg/dL   Total Protein 7.5 6.5 - 8.1 g/dL   Albumin 3.6 3.5 - 5.0 g/dL   AST 22 15 - 41 U/L   ALT 20 0 - 44 U/L   Alkaline Phosphatase 90 38 - 126 U/L   Total Bilirubin 0.5 0.3 - 1.2 mg/dL   GFR calc non Af Amer >60 >60 mL/min   GFR calc Af Amer >60 >60 mL/min   Anion gap 8 5 - 15    Comment: Performed at [facility], [address] [place], [place] [address]  Rapid HIV  screen (HIV 1/2 Ab+Ag)     Status: None   Collection Time: 10/10/18  6:33 PM  Result Value Ref  Range   HIV-1 P24 Antigen - HIV24 NON REACTIVE NON REACTIVE   HIV 1/2 Antibodies NON REACTIVE NON REACTIVE   Interpretation (HIV Ag Ab)      A non reactive test result means that HIV 1 or HIV 2 antibodies and HIV 1 p24 antigen were not detected in the specimen.    Comment: RESULT CALLED TO, READ BACK BY AND VERIFIED WITH: TALKINGTON,J RN  ON 10/10/2018 JACKSON,K Performed at [facility], [address], [place], [place] [address]   Urine rapid drug screen (hosp performed)     Status: Abnormal   Collection Time: 10/10/18  6:33 PM  Result Value Ref Range   Opiates NONE DETECTED NONE DETECTED   Cocaine POSITIVE (A) NONE DETECTED   Benzodiazepines NONE DETECTED NONE DETECTED   Amphetamines NONE DETECTED NONE DETECTED   Tetrahydrocannabinol POSITIVE (A) NONE DETECTED   Barbiturates NONE DETECTED NONE DETECTED    Comment: (NOTE) DRUG SCREEN FOR MEDICAL PURPOSES ONLY.  IF CONFIRMATION IS NEEDED FOR ANY PURPOSE, NOTIFY LAB WITHIN 5 DAYS. LOWEST DETECTABLE LIMITS FOR URINE DRUG SCREEN Drug Class                     Cutoff (ng/mL) Amphetamine and metabolites    1000 Barbiturate and metabolites    200 Benzodiazepine                 200 Tricyclics and metabolites     300 Opiates and metabolites        300 Cocaine and metabolites        300 THC                            50 Performed at [facility], [address] [place], [place] [address]   I-Stat beta hCG blood, ED     Status: None   Collection Time: 10/10/18  6:43 PM  Result Value Ref Range   I-stat hCG, quantitative <5.0 <5 mIU/mL   Comment 3            Comment:   GEST. AGE      CONC.  (mIU/mL)   <=1 WEEK        5 - 50     2 WEEKS       50 - 500     3 WEEKS       100 - 10,000     4 WEEKS     1,000 - 30,000        FEMALE AND NON-PREGNANT FEMALE:     LESS THAN 5 mIU/mL   Wet prep, genital      Status: Abnormal   Collection Time: 10/10/18  7:15 PM  Result Value Ref Range   Yeast Wet Prep HPF POC NONE SEEN NONE SEEN   Trich, Wet Prep NONE SEEN NONE SEEN   Clue Cells Wet Prep HPF POC PRESENT (A) NONE SEEN   WBC, Wet Prep HPF POC MANY (A) NONE SEEN   Sperm NONE SEEN     Comment: Performed at [facility], [address] [place], [place] [address]  Magnesium     Status: Abnormal   Collection Time: 10/10/18 10:06 PM  Result Value Ref Range   Magnesium 1.6 (L) 1.7 - 2.4 mg/dL    Comment: Performed at [facility], [address] [place], [place] [address]  Phosphorus     Status: Abnormal  Collection Time: 10/10/18 10:06 PM  Result Value Ref Range   Phosphorus 2.2 (L) 2.5 - 4.6 mg/dL    Comment: Performed at [facility], [address] [place], [place] [address]  Vitamin B12     Status: Abnormal   Collection Time: 10/10/18 10:06 PM  Result Value Ref Range   Vitamin B-12 154 (L) 180 - 914 pg/mL    Comment: (NOTE) This assay is not validated for testing neonatal or myeloproliferative syndrome specimens for Vitamin B12 levels. Performed at [facility], [address], [place], [place] [address]   Folate     Status: None   Collection Time: 10/10/18 10:06 PM  Result Value Ref Range   Folate 9.7 >5.9 ng/mL    Comment: Performed at [facility], [address], [place], [place] [address]  Iron and TIBC     Status: Abnormal   Collection Time: 10/10/18 10:06 PM  Result Value Ref Range   Iron 9 (L) 28 - 170 ug/dL   TIBC 748 270 - 786 ug/dL   Saturation Ratios 3 (L) 10.4 - 31.8 %   UIBC 291 ug/dL    Comment: Performed at [facility], [address], [place], [place] [address]  Ferritin     Status: None   Collection Time: 10/10/18 10:06 PM  Result Value Ref Range   Ferritin 13 11 - 307 ng/mL    Comment: Performed at [facility], [address], [place], [place] [address]  Reticulocytes     Status: Abnormal   Collection Time: 10/10/18 10:06 PM  Result Value Ref Range   Retic Ct Pct 0.5 0.4 - 3.1 %   RBC. 3.35 (L) 3.87 - 5.11 MIL/uL   Retic Count, Absolute 17.4 (L) 19.0 - 186.0 K/uL   Immature Retic Fract 10.9 2.3 - 15.9 %    Comment: Performed at [facility], [address] [place], [place] [address]  Lactic acid, plasma     Status: None   Collection Time: 10/10/18 11:32 PM  Result Value Ref Range   Lactic Acid, Venous 0.6 0.5 - 1.9 mmol/L    Comment: Performed at [facility], [address], [place], [place] [address]  Procalcitonin     Status: None   Collection Time: 10/10/18 11:32 PM  Result Value Ref Range   Procalcitonin 0.14 ng/mL    Comment:        Interpretation: PCT (Procalcitonin) <= 0.5 ng/mL: Systemic infection (sepsis) is not likely. Local bacterial infection is possible. (NOTE)       Sepsis PCT Algorithm           Lower Respiratory Tract                                      Infection PCT Algorithm    ----------------------------     ----------------------------         PCT < 0.25 ng/mL                PCT < 0.10 ng/mL         Strongly encourage             Strongly discourage   discontinuation of antibiotics    initiation of antibiotics    ----------------------------     -----------------------------       PCT 0.25 - 0.50 ng/mL  PCT 0.10 - 0.25 ng/mL               OR       >80% decrease in PCT            Discourage initiation of                                            antibiotics      Encourage discontinuation           of antibiotics    ----------------------------     -----------------------------         PCT >= 0.50 ng/mL              PCT 0.26 - 0.50 ng/mL               AND        <80% decrease in PCT             Encourage initiation of                                             antibiotics       Encourage continuation           of  antibiotics    ----------------------------     -----------------------------        PCT >= 0.50 ng/mL                  PCT > 0.50 ng/mL               AND         increase in PCT                  Strongly encourage                                      initiation of antibiotics    Strongly encourage escalation           of antibiotics                                     -----------------------------                                           PCT <= 0.25 ng/mL                                                 OR                                        > 80% decrease in PCT  Discontinue / Do not initiate                                             antibiotics Performed at [facility], [address], [place], [place] [address]   Protime-INR     Status: None   Collection Time: 10/10/18 11:32 PM  Result Value Ref Range   Prothrombin Time 15.2 11.4 - 15.2 seconds   INR 1.2 0.8 - 1.2    Comment: (NOTE) INR goal varies based on device and disease states. Performed at [facility], [address] [place], [place] [address]   APTT     Status: Abnormal   Collection Time: 10/10/18 11:32 PM  Result Value Ref Range   aPTT 38 (H) 24 - 36 seconds    Comment:        IF BASELINE aPTT IS ELEVATED, SUGGEST PATIENT RISK ASSESSMENT BE USED TO DETERMINE APPROPRIATE ANTICOAGULANT THERAPY. Performed at [facility], [address], [place], [place] [address]   Prealbumin     Status: Abnormal   Collection Time: 10/11/18  2:33 AM  Result Value Ref Range   Prealbumin 8.0 (L) 18 - 38 mg/dL    Comment: Performed at [facility], [address], [place], [place] [address]  Lactic acid, plasma     Status: None   Collection Time: 10/11/18  2:33 AM  Result Value Ref Range   Lactic Acid, Venous 1.0 0.5 - 1.9 mmol/L    Comment: Performed at [facility], [address],  [place], [place] [address]  Magnesium     Status: None   Collection Time: 10/11/18  5:30 AM  Result Value Ref Range   Magnesium 2.0 1.7 - 2.4 mg/dL    Comment: Performed at [facility], [address] [place], [place] [address]  Phosphorus     Status: Abnormal   Collection Time: 10/11/18  5:30 AM  Result Value Ref Range   Phosphorus 2.4 (L) 2.5 - 4.6 mg/dL    Comment: Performed at [facility], [address] [place], [place] [address]  TSH     Status: None   Collection Time: 10/11/18  5:30 AM  Result Value Ref Range   TSH 0.854 0.350 - 4.500 uIU/mL    Comment: Performed by a 3rd Generation assay with a functional sensitivity of <=0.01 uIU/mL. Performed at [facility], [address] [place], [place] [address]   Comprehensive metabolic panel     Status: Abnormal   Collection Time: 10/11/18  5:30 AM  Result Value Ref Range   Sodium 139 135 - 145 mmol/L   Potassium 3.4 (L) 3.5 - 5.1 mmol/L    Comment: DELTA CHECK NOTED REPEATED TO VERIFY NO VISIBLE HEMOLYSIS    Chloride 112 (H) 98 - 111 mmol/L   CO2 22 22 - 32 mmol/L   Glucose, Bld 99 70 - 99 mg/dL   BUN 7 6 - 20 mg/dL   Creatinine, Ser 2.84 0.44 - 1.00 mg/dL   Calcium 8.1 (L) 8.9 - 10.3 mg/dL   Total Protein 5.9 (L) 6.5 - 8.1 g/dL   Albumin 3.0 (L) 3.5 - 5.0 g/dL   AST 13 (L) 15 - 41 U/L   ALT 15 0 - 44 U/L   Alkaline Phosphatase 68 38 - 126 U/L  Total Bilirubin 0.5 0.3 - 1.2 mg/dL   GFR calc non Af Amer >60 >60 mL/min   GFR calc Af Amer >60 >60 mL/min   Anion gap 5 5 - 15    Comment: Performed at [facility], [address] [place], [place] [address]  CBC     Status: Abnormal   Collection Time: 10/11/18  5:30 AM  Result Value Ref Range   WBC 12.1 (H) 4.0 - 10.5 K/uL   RBC 3.50 (L) 3.87 - 5.11 MIL/uL   Hemoglobin 8.1 (L) 12.0 - 15.0 g/dL   HCT 60.4 (L) 54.0 - 98.1 %   MCV 81.1 80.0 - 100.0 fL   MCH 23.1 (L) 26.0 - 34.0 pg   MCHC 28.5 (L)  30.0 - 36.0 g/dL   RDW 19.1 (H) 47.8 - 29.5 %   Platelets 367 150 - 400 K/uL   nRBC 0.0 0.0 - 0.2 %    Comment: Performed at [facility], [address] [place], [place] [address]    Current Facility-Administered Medications  Medication Dose Route Frequency Provider Last Rate Last Dose  . acetaminophen (TYLENOL) tablet 650 mg  650 mg Oral Q6H PRN Stevie Kern A, NP   650 mg at 10/11/18 0526  . cefTRIAXone (ROCEPHIN) 1 g in sodium chloride 0.9 % 100 mL IVPB  1 g Intravenous Q24H Doutova, Anastassia, MD      . feeding supplement (ENSURE ENLIVE) (ENSURE ENLIVE) liquid 237 mL  237 mL Oral BID BM Doutova, Anastassia, MD   237 mL at 10/11/18 0852  . ferric gluconate (NULECIT) 125 mg in sodium chloride 0.9 % 100 mL IVPB  125 mg Intravenous Daily [place], Eric J, DO 110 mL/hr at 10/11/18 1001 125 mg at 10/11/18 1001  . folic acid (FOLVITE) tablet 1 mg  1 mg Oral Daily Doutova, Anastassia, MD   1 mg at 10/11/18 0842  . HYDROcodone-acetaminophen (NORCO/VICODIN) 5-325 MG per tablet 1-2 tablet  1-2 tablet Oral Q6H PRN Macon Large, NP   2 tablet at 10/11/18 1101  . LORazepam (ATIVAN) tablet 1 mg  1 mg Oral Q6H PRN Therisa Doyne, MD       Or  . LORazepam (ATIVAN) injection 1 mg  1 mg Intravenous Q6H PRN Doutova, Anastassia, MD      . metroNIDAZOLE (FLAGYL) tablet 500 mg  500 mg Oral Q12H Doutova, Anastassia, MD   500 mg at 10/11/18 0841  . mirtazapine (REMERON) tablet 7.5 mg  7.5 mg Oral QHS Alahni Varone, MD      . multivitamin with minerals tablet 1 tablet  1 tablet Oral Daily Therisa Doyne, MD   1 tablet at 10/11/18 0842  . nystatin (MYCOSTATIN) 100000 UNIT/ML suspension 500,000 Units  5 mL Mouth/Throat QID Therisa Doyne, MD   500,000 Units at 10/11/18 0845  . ondansetron (ZOFRAN) tablet 4 mg  4 mg Oral Q6H PRN Doutova, Anastassia, MD       Or  . ondansetron (ZOFRAN) injection 4 mg  4 mg Intravenous Q6H PRN Doutova, Anastassia, MD   4 mg at  10/11/18 1006  . polyethylene glycol (MIRALAX / GLYCOLAX) packet 17 g  17 g Oral Daily PRN Doutova, Anastassia, MD      . senna (SENOKOT) tablet 8.6 mg  1 tablet Oral BID Therisa Doyne, MD   8.6 mg at 10/11/18 0843  . thiamine (VITAMIN B-1) tablet 100 mg  100 mg Oral Daily Doutova, Anastassia, MD   100 mg at 10/11/18 [phone]  Or  . thiamine (B-1) injection 100 mg  100 mg Intravenous Daily Therisa Doyne, MD        Musculoskeletal: Strength & Muscle Tone: within normal limits Gait & Station: normal Patient leans: N/A  Psychiatric Specialty Exam: Physical Exam  Psychiatric: Her speech is normal. Judgment and thought content normal. She is slowed and withdrawn. Cognition and memory are normal. She exhibits a depressed mood.    Review of Systems  Constitutional: Positive for malaise/fatigue.  HENT: Negative.   Eyes: Negative.   Respiratory: Negative.   Cardiovascular: Negative.   Gastrointestinal: Negative.   Genitourinary: Positive for flank pain.  Skin: Negative.   Neurological: Negative.   Endo/Heme/Allergies: Negative.   Psychiatric/Behavioral: Positive for depression.    Blood pressure 101/71, pulse 61, temperature 97.8 F (36.6 C), temperature source Oral, resp. rate 17, height 5\' 3"  (1.6 m), weight 50.6 kg, last menstrual period 10/03/2018, SpO2 100 %, unknown if currently breastfeeding.Body mass index is 19.77 kg/m.  General Appearance: Casual  Eye Contact:  Good  Speech:  Clear and Coherent  Volume:  Normal  Mood:  Dysphoric  Affect:  Appropriate  Thought Process:  Coherent and Linear  Orientation:  Full (Time, Place, and Person)  Thought Content:  Logical  Suicidal Thoughts:  No  Homicidal Thoughts:  No  Memory:  Immediate;   Fair Recent;   Good Remote;   Good  Judgement:  Intact  Insight:  Fair  Psychomotor Activity:  Normal  Concentration:  Concentration: Fair and Attention Span: Fair  Recall:  Good  Fund of Knowledge:  Good  Language:  Good   Akathisia:  No  Handed:  Right  AIMS (if indicated):     Assets:  Communication Skills Desire for Improvement  ADL's:  Intact  Cognition:  WNL  Sleep:   poor     Treatment Plan Summary: 38 year old female who denies prior history of mental illness but was found to be depressed on admission.  Patient continues to report feeling depressed but denies psychosis, delusions or suicidal thoughts and open to medication management of depression.  Diagnosis: Adjustment disorder with Depression  Recommendations: -Consider Remeron 7.5 mg at bedtime for depression -Consider social worker consult to facilitate patient referral to outpatient psychiatrist upon discharge. -Psychiatric service signing out.  Disposition: No evidence of imminent risk to self or others at present.   Patient does not meet criteria for psychiatric inpatient admission. Supportive therapy provided about ongoing stressors. Re-consult psych service as needed  Thedore Mins, MD 10/11/2018 11:56 AM

## 2018-10-11 NOTE — Progress Notes (Signed)
PROGRESS NOTE    Kathy Willis  WGN:[id number] DOB: [birth year] DOA: 10/10/2018 PCP: Patient, No Pcp Per   Chief complaint: Right-sided flank pain   Brief Narrative:   Kathy Willis is a 38 y.o. female  with past medical history significant for anemia who presented with complaint of severe right sided flank pain associated with dysuria.  Patient also reports mild pelvic pain, right lower quadrant abdominal pain with malodorous urine.  Patient also notes some slight vaginal discharge/odor.  She reports continues to binge drink and smoke.  Increased stress as of late with decreased appetite and poor oral intake.  She does endorse heavy menses that last 4-5 days.  Denies any blood in her stool. States she has been feeling very down she has been sleeping most of the day stopped eating, very tearful feels very unhappy at this point denies suicidal ideations denies homicidal ideations denies being unsafe  In the ED, patient noted to be febrile to 101.5, tachycardic at 111, BP stable.  WBC count elevated 15.4.  Potassium 2.7.  Abdomen/pelvis without contrast notable for air fluid levels within the small bowel and colon suspect for enterocolitis, no stones seen, no perinephric stranding appreciated.  Urinalysis notable for positive nitrite, greater than 50 WBCs.  Vaginal wet prep notable for moderate WBCs and clue cells.  Patient was started on IV ceftriaxone and Flagyl for sepsis related to UTI and bacterial vaginosis.  Hospitalist service consulted for admission.   Assessment & Plan:   Principal Problem:   Acute pyelonephritis Active Problems:   Hypokalemia   Dehydration   Thrush   Bacterial vaginosis   Sepsis (HCC)   Anemia   Depression   Alcohol abuse   Sepsis Acute cystitis without hematuria Patient presenting with right-sided flank pain with dysuria and increased urinary frequency.  Urinalysis notable for positive nitrate, greater than 50 WBCs.  Patient febrile up to one 1.5 and  tachycardic to 111.  CT abdomen/pelvis without findings consistent with pyelonephritis, although complains of right-sided flank pain. --Blood cultures and urine culture pending --WBC count trending down; 15.4-->12.1 --Continue IV antibiotics with ceftriaxone --Await culture identification and susceptibilities --Supportive care, IV fluid hydration  Hypokalemia Hypomagnesemia Patient's potassium was noted to be 2.7 with magnesium of 1.6 on admission.  These were repleted.  Etiology likely from a GI loss versus poor intake over the previous few days given sepsis as above. --Magnesium up to 2.0, potassium 3.4 --We will give an additional dose of potassium 40 mEq today --Repeat electrolytes in the a.m. to include magnesium  Thrush Patient reports difficulty swallowing; with similar issues in the past related to thrush.  HIV nonreactive. --Continue nystatin  Bacterial vaginosis Vaginal wet prep notable for clue cells and WBCs. --Continue Flagyl 500 mg p.o. twice daily x 7 days  Iron deficiency anemia Patient with history of chronic anemia, likely related to her heavy menses.  Hemoglobin 9.9 on admission with MCV of 78.  Iron level 9, TIBC 300, ferritin 13, folic acid 9.7. --Start Ferrlecit 125 mg IV daily x3 days; then will transition to oral iron  Depression --Psychiatry evaluation  Severity of Illness: The appropriate patient status for this patient is INPATIENT. Inpatient status is judged to be reasonable and necessary in order to provide the required intensity of service to ensure the patient's safety. The patient's presenting symptoms, physical exam findings, and initial radiographic and laboratory data in the context of their chronic comorbidities is felt to place them at high risk for further clinical  deterioration. Furthermore, it is not anticipated that the patient will be medically stable for discharge from the hospital within 2 midnights of admission. The following factors support  the patient status of inpatient.   " The patient's presenting symptoms include right-sided flank pain, dysuria, weakness/fatigue " The worrisome physical exam findings include CVA tenderness, tachycardic " The initial radiographic and laboratory data are worrisome because of WBC count of 15.4, positive nitrite greater than 50 WBCs on UA " The chronic co-morbidities include chronic anemia.   * I certify that at the point of admission it is my clinical judgment that the patient will require inpatient hospital care spanning beyond 2 midnights from the point of admission due to high intensity of service, high risk for further deterioration and high frequency of surveillance required.*   DVT prophylaxis: SCDs Code Status: Full code Family Communication: Family at bedside Disposition Plan: Inpatient, IV antibiotics for underlying sepsis/UTI; anticipate discharge home in 2-3 days   Consultants:   Psychiatry  Procedures: None  Antimicrobials:   Ceftriaxone 2/28>>>  Flagyl 2/28>>>  Nystatin 2/28>>>   Subjective: Patient seen and examined at bedside, resting comfortably in bed.  Family members present.  States feeling slightly better but continues with right-sided flank pain.  Also reports dysuria continues but improved.  Feels tired/fatigue.  No other complaints at this time.  Denies headache, no visual changes, no chest pain, no palpitations, no shortness of breath, no nausea/vomiting/diarrhea, no abdominal pain, no cough/congestion, no night sweats, no paresthesias.  No acute events overnight per nursing staff.  Objective: Vitals:   10/10/18 2327 10/11/18 0112 10/11/18 0432 10/11/18 0726  BP: (!) 98/54 95/62 101/71   Pulse: 78 70 61   Resp: 18 18 17    Temp: 99 F (37.2 C) 97.8 F (36.6 C) 97.8 F (36.6 C)   TempSrc: Oral Oral Oral   SpO2: 98% 98% 100%   Weight:    50.6 kg  Height:    5\' 3"  (1.6 m)    Intake/Output Summary (Last 24 hours) at 10/11/2018 0849 Last data filed  at 10/11/2018 0304 Gross per 24 hour  Intake 1599.42 ml  Output -  Net 1599.42 ml   Filed Weights   10/11/18 0726  Weight: 50.6 kg    Examination:  General exam: Appears calm and comfortable  Respiratory system: Clear to auscultation. Respiratory effort normal. Cardiovascular system: S1 & S2 heard, RRR. No JVD, murmurs, rubs, gallops or clicks. No pedal edema. Gastrointestinal system: Abdomen is nondistended, soft and nontender. No organomegaly or masses felt. Normal bowel sounds heard.  Mild right-sided CVA tenderness Central nervous system: Alert and oriented. No focal neurological deficits. Extremities: Symmetric 5 x 5 power. Skin: No rashes, lesions or ulcers Psychiatry: Judgement and insight appear normal.  Slightly depressed mood, slightly withdrawn    Data Reviewed: I have personally reviewed following labs and imaging studies  CBC: Recent Labs  Lab 10/10/18 1833 10/11/18 0530  WBC 15.4* 12.1*  HGB 9.9* 8.1*  HCT 33.2* 28.4*  MCV 78.9* 81.1  PLT 450* 367   Basic Metabolic Panel: Recent Labs  Lab 10/10/18 1833 10/10/18 2206 10/11/18 0530  NA 134*  --  139  K 2.7*  --  3.4*  CL 101  --  112*  CO2 25  --  22  GLUCOSE 117*  --  99  BUN 6  --  7  CREATININE 0.71  --  0.55  CALCIUM 8.6*  --  8.1*  MG  --  1.6* 2.0  PHOS  --  2.2* 2.4*   GFR: Estimated Creatinine Clearance: 76.9 mL/min (by C-G formula based on SCr of 0.55 mg/dL). Liver Function Tests: Recent Labs  Lab 10/10/18 1833 10/11/18 0530  AST 22 13*  ALT 20 15  ALKPHOS 90 68  BILITOT 0.5 0.5  PROT 7.5 5.9*  ALBUMIN 3.6 3.0*   No results for input(s): LIPASE, AMYLASE in the last 168 hours. No results for input(s): AMMONIA in the last 168 hours. Coagulation Profile: Recent Labs  Lab 10/10/18 2332  INR 1.2   Cardiac Enzymes: No results for input(s): CKTOTAL, CKMB, CKMBINDEX, TROPONINI in the last 168 hours. BNP (last 3 results) No results for input(s): PROBNP in the last 8760  hours. HbA1C: No results for input(s): HGBA1C in the last 72 hours. CBG: No results for input(s): GLUCAP in the last 168 hours. Lipid Profile: No results for input(s): CHOL, HDL, LDLCALC, TRIG, CHOLHDL, LDLDIRECT in the last 72 hours. Thyroid Function Tests: Recent Labs    10/11/18 0530  TSH 0.854   Anemia Panel: Recent Labs    10/10/18 2206  VITAMINB12 154*  FOLATE 9.7  FERRITIN 13  TIBC 300  IRON 9*  RETICCTPCT 0.5   Sepsis Labs: Recent Labs  Lab 10/10/18 2332 10/11/18 0233  PROCALCITON 0.14  --   LATICACIDVEN 0.6 1.0    Recent Results (from the past 240 hour(s))  Wet prep, genital     Status: Abnormal   Collection Time: 10/10/18  7:15 PM  Result Value Ref Range Status   Yeast Wet Prep HPF POC NONE SEEN NONE SEEN Final   Trich, Wet Prep NONE SEEN NONE SEEN Final   Clue Cells Wet Prep HPF POC PRESENT (A) NONE SEEN Final   WBC, Wet Prep HPF POC MANY (A) NONE SEEN Final   Sperm NONE SEEN  Final    Comment: Performed at [facility], [address] [place], [place] [address]         Radiology Studies: Ct Renal Stone Study  Result Date: 10/10/2018 CLINICAL DATA:  Right flank pain for 2 days EXAM: CT ABDOMEN AND PELVIS WITHOUT CONTRAST TECHNIQUE: Multidetector CT imaging of the abdomen and pelvis was performed following the standard protocol without IV contrast. COMPARISON:  None. FINDINGS: Lower chest: No acute abnormality. Hepatobiliary: No focal liver abnormality is seen. No gallstones, gallbladder wall thickening, or biliary dilatation. Pancreas: Unremarkable. No pancreatic ductal dilatation or surrounding inflammatory changes. Spleen: Normal in size without focal abnormality. Adrenals/Urinary Tract: Adrenal glands are unremarkable. Kidneys are normal, without renal calculi, focal lesion, or hydronephrosis. Bladder is unremarkable. Stomach/Bowel: Stomach is within normal limits. Appendix appears normal. No evidence of bowel wall thickening,  distention, or inflammatory changes. Air-fluid levels within the small bowel and colon as can be seen with enterocolitis. No pneumatosis, pneumoperitoneum or portal venous gas. Vascular/Lymphatic: No significant vascular findings are present. No enlarged abdominal or pelvic lymph nodes. Reproductive: Uterus and bilateral adnexa are unremarkable. Other: No abdominal wall hernia or abnormality. No abdominopelvic ascites. Musculoskeletal: No acute osseous abnormality. No aggressive osseous lesion. IMPRESSION: 1. Air-fluid levels within the small bowel and colon as can be seen with enterocolitis. Electronically Signed   By: Elige KoHetal  Patel   On: 10/10/2018 19:52        Scheduled Meds: . feeding supplement (ENSURE ENLIVE)  237 mL Oral BID BM  . folic acid  1 mg Oral Daily  . metroNIDAZOLE  500 mg Oral Q12H  . multivitamin with minerals  1 tablet Oral  Daily  . nystatin  5 mL Mouth/Throat QID  . potassium chloride  40 mEq Oral Once  . senna  1 tablet Oral BID  . thiamine  100 mg Oral Daily   Or  . thiamine  100 mg Intravenous Daily   Continuous Infusions: . sodium chloride 100 mL/hr at 10/11/18 0119  . cefTRIAXone (ROCEPHIN)  IV    . ferric gluconate (FERRLECIT/NULECIT) IV       LOS: 0 days    Time spent: 31 minutes    Alvira Philips [place], DO [facility] Pager [phone]  If 7PM-7AM, please contact night-coverage [email or web address] Password [facility] 10/11/2018, 8:49 AM

## 2018-10-12 LAB — CBC
HCT: 26.8 % — ABNORMAL LOW (ref 36.0–46.0)
Hemoglobin: 8 g/dL — ABNORMAL LOW (ref 12.0–15.0)
MCH: 23.7 pg — ABNORMAL LOW (ref 26.0–34.0)
MCHC: 29.9 g/dL — ABNORMAL LOW (ref 30.0–36.0)
MCV: 79.3 fL — AB (ref 80.0–100.0)
Platelets: 364 10*3/uL (ref 150–400)
RBC: 3.38 MIL/uL — AB (ref 3.87–5.11)
RDW: 18.5 % — ABNORMAL HIGH (ref 11.5–15.5)
WBC: 12.7 10*3/uL — ABNORMAL HIGH (ref 4.0–10.5)
nRBC: 0 % (ref 0.0–0.2)

## 2018-10-12 LAB — BASIC METABOLIC PANEL
Anion gap: 5 (ref 5–15)
BUN: <5 mg/dL — ABNORMAL LOW (ref 6–20)
CO2: 24 mmol/L (ref 22–32)
Calcium: 8.3 mg/dL — ABNORMAL LOW (ref 8.9–10.3)
Chloride: 108 mmol/L (ref 98–111)
Creatinine, Ser: 0.62 mg/dL (ref 0.44–1.00)
GFR calc Af Amer: >60 mL/min (ref >60)
GFR calc non Af Amer: >60 mL/min (ref >60)
Glucose, Bld: 102 mg/dL — ABNORMAL HIGH (ref 70–99)
POTASSIUM: 3.4 mmol/L — AB (ref 3.5–5.1)
Sodium: 137 mmol/L (ref 135–145)

## 2018-10-12 LAB — MAGNESIUM: Magnesium: 2.1 mg/dL (ref 1.7–2.4)

## 2018-10-12 MED ORDER — POTASSIUM CHLORIDE CRYS ER 20 MEQ PO TBCR
40.0000 meq | EXTENDED_RELEASE_TABLET | ORAL | Status: AC
  Administered 2018-10-12 (×2): 40 meq via ORAL
  Filled 2018-10-12 (×2): qty 2

## 2018-10-12 NOTE — Progress Notes (Signed)
PROGRESS NOTE    SHAHADAH PELOWSKI  DPO:[id number] DOB: [birth year] DOA: 10/10/2018 PCP: Patient, No Pcp Per   Chief complaint: Right-sided flank pain   Brief Narrative:   ZENNIE GOLTZ is a 38 y.o. female  with past medical history significant for anemia who presented with complaint of severe right sided flank pain associated with dysuria.  Patient also reports mild pelvic pain, right lower quadrant abdominal pain with malodorous urine.  Patient also notes some slight vaginal discharge/odor.  She reports continues to binge drink and smoke.  Increased stress as of late with decreased appetite and poor oral intake.  She does endorse heavy menses that last 4-5 days.  Denies any blood in her stool. States she has been feeling very down she has been sleeping most of the day stopped eating, very tearful feels very unhappy at this point denies suicidal ideations denies homicidal ideations denies being unsafe  In the ED, patient noted to be febrile to 101.5, tachycardic at 111, BP stable.  WBC count elevated 15.4.  Potassium 2.7.  Abdomen/pelvis without contrast notable for air fluid levels within the small bowel and colon suspect for enterocolitis, no stones seen, no perinephric stranding appreciated.  Urinalysis notable for positive nitrite, greater than 50 WBCs.  Vaginal wet prep notable for moderate WBCs and clue cells.  Patient was started on IV ceftriaxone and Flagyl for sepsis related to UTI and bacterial vaginosis.  Hospitalist service consulted for admission.   Assessment & Plan:   Principal Problem:   Acute pyelonephritis Active Problems:   Hypokalemia   Dehydration   Thrush   Bacterial vaginosis   Sepsis (HCC)   Anemia   Depression   Alcohol abuse   Sepsis Acute cystitis without hematuria Patient presenting with right-sided flank pain with dysuria and increased urinary frequency.  Urinalysis notable for positive nitrate, greater than 50 WBCs.  Patient febrile up to one 1.5 and  tachycardic to 111.  CT abdomen/pelvis without findings consistent with pyelonephritis, although complains of right-sided flank pain.  Unfortunately urine culture was not performed at time of admission. --Fever curve improving, although with low-grade temperature past 24 hours of 100.4 --Blood cultures pending --WBC count 15.4-->12.1-->12.7 --Continue IV antibiotics with ceftriaxone --Supportive care  Hypokalemia Hypomagnesemia Patient's potassium was noted to be 2.7 with magnesium of 1.6 on admission.  These were repleted.  Etiology likely from a GI loss versus poor intake over the previous few days given sepsis as above. --K 3.4 today; will replete. Mg normal --Repeat electrolytes in the a.m. to include magnesium  Thrush Patient reports difficulty swallowing; with similar issues in the past related to thrush.  HIV nonreactive. --Continue nystatin  Bacterial vaginosis Vaginal wet prep notable for clue cells and WBCs. --Continue Flagyl 500 mg p.o. twice daily x 7 days  Iron deficiency anemia Patient with history of chronic anemia, likely related to her heavy menses.  Hemoglobin 9.9 on admission with MCV of 78.  Iron level 9, TIBC 300, ferritin 13, folic acid 9.7. --Start Ferrlecit 125 mg IV daily x3 days; then will transition to oral iron  Adjustment disorder with depression Evaluated by psychiatry on 10/11/2018; recommended Remeron 7.5 mg qHS and outpatient psychiatry follow-up.   DVT prophylaxis: SCDs Code Status: Full code Family Communication: None Disposition Plan: Inpatient, IV antibiotics for underlying sepsis/UTI; anticipate discharge home in 2-3 days   Consultants:   Psychiatry  Procedures: None  Antimicrobials:   Ceftriaxone 2/28>>>  Flagyl 2/28>>>  Nystatin 2/28>>>   Subjective: Patient seen  and examined at bedside, resting comfortably in bed.  Reports continued fevers overnight, with T-max 100.4.  Abdominal discomfort improving to the right side/flank  area.  Continues with fatigue/global weakness. No other complaints at this time.  Denies headache, no visual changes, no chest pain, no palpitations, no shortness of breath, no nausea/vomiting/diarrhea, no abdominal pain, no cough/congestion, no night sweats, no paresthesias.  No acute events overnight per nursing staff.  Objective: Vitals:   10/11/18 2313 10/12/18 0012 10/12/18 0305 10/12/18 0719  BP:   109/62 101/77  Pulse:   69 68  Resp:   16 18  Temp: (!) 100.4 F (38 C) 99.4 F (37.4 C) 98.4 F (36.9 C) 97.9 F (36.6 C)  TempSrc: Oral Oral Oral Oral  SpO2:   99% 97%  Weight:      Height:        Intake/Output Summary (Last 24 hours) at 10/12/2018 0858 Last data filed at 10/12/2018 0600 Gross per 24 hour  Intake 1789.97 ml  Output -  Net 1789.97 ml   Filed Weights   10/11/18 0726  Weight: 50.6 kg    Examination:  General exam: Appears calm and comfortable  Respiratory system: Clear to auscultation. Respiratory effort normal. Cardiovascular system: S1 & S2 heard, RRR. No JVD, murmurs, rubs, gallops or clicks. No pedal edema. Gastrointestinal system: Abdomen is nondistended, soft and nontender. No organomegaly or masses felt. Normal bowel sounds heard.  Mild right-sided CVA tenderness Central nervous system: Alert and oriented. No focal neurological deficits. Extremities: Symmetric 5 x 5 power. Skin: No rashes, lesions or ulcers Psychiatry: Judgement and insight appear normal.  Slightly depressed mood, slightly withdrawn    Data Reviewed: I have personally reviewed following labs and imaging studies  CBC: Recent Labs  Lab 10/10/18 1833 10/11/18 0530 10/12/18 0539  WBC 15.4* 12.1* 12.7*  HGB 9.9* 8.1* 8.0*  HCT 33.2* 28.4* 26.8*  MCV 78.9* 81.1 79.3*  PLT 450* 367 364   Basic Metabolic Panel: Recent Labs  Lab 10/10/18 1833 10/10/18 2206 10/11/18 0530 10/12/18 0539  NA 134*  --  139 137  K 2.7*  --  3.4* 3.4*  CL 101  --  112* 108  CO2 25  --  22 24    GLUCOSE 117*  --  99 102*  BUN 6  --  7 <5*  CREATININE 0.71  --  0.55 0.62  CALCIUM 8.6*  --  8.1* 8.3*  MG  --  1.6* 2.0 2.1  PHOS  --  2.2* 2.4*  --    GFR: Estimated Creatinine Clearance: 76.9 mL/min (by C-G formula based on SCr of 0.62 mg/dL). Liver Function Tests: Recent Labs  Lab 10/10/18 1833 10/11/18 0530  AST 22 13*  ALT 20 15  ALKPHOS 90 68  BILITOT 0.5 0.5  PROT 7.5 5.9*  ALBUMIN 3.6 3.0*   No results for input(s): LIPASE, AMYLASE in the last 168 hours. No results for input(s): AMMONIA in the last 168 hours. Coagulation Profile: Recent Labs  Lab 10/10/18 2332  INR 1.2   Cardiac Enzymes: No results for input(s): CKTOTAL, CKMB, CKMBINDEX, TROPONINI in the last 168 hours. BNP (last 3 results) No results for input(s): PROBNP in the last 8760 hours. HbA1C: No results for input(s): HGBA1C in the last 72 hours. CBG: No results for input(s): GLUCAP in the last 168 hours. Lipid Profile: No results for input(s): CHOL, HDL, LDLCALC, TRIG, CHOLHDL, LDLDIRECT in the last 72 hours. Thyroid Function Tests: Recent Labs    10/11/18  0530  TSH 0.854   Anemia Panel: Recent Labs    10/10/18 2206  VITAMINB12 154*  FOLATE 9.7  FERRITIN 13  TIBC 300  IRON 9*  RETICCTPCT 0.5   Sepsis Labs: Recent Labs  Lab 10/10/18 2332 10/11/18 0233  PROCALCITON 0.14  --   LATICACIDVEN 0.6 1.0    Recent Results (from the past 240 hour(s))  Wet prep, genital     Status: Abnormal   Collection Time: 10/10/18  7:15 PM  Result Value Ref Range Status   Yeast Wet Prep HPF POC NONE SEEN NONE SEEN Final   Trich, Wet Prep NONE SEEN NONE SEEN Final   Clue Cells Wet Prep HPF POC PRESENT (A) NONE SEEN Final   WBC, Wet Prep HPF POC MANY (A) NONE SEEN Final   Sperm NONE SEEN  Final    Comment: Performed at [facility], [address] [place], [place] [address]         Radiology Studies: Ct Renal Stone Study  Result Date: 10/10/2018 CLINICAL DATA:  Right  flank pain for 2 days EXAM: CT ABDOMEN AND PELVIS WITHOUT CONTRAST TECHNIQUE: Multidetector CT imaging of the abdomen and pelvis was performed following the standard protocol without IV contrast. COMPARISON:  None. FINDINGS: Lower chest: No acute abnormality. Hepatobiliary: No focal liver abnormality is seen. No gallstones, gallbladder wall thickening, or biliary dilatation. Pancreas: Unremarkable. No pancreatic ductal dilatation or surrounding inflammatory changes. Spleen: Normal in size without focal abnormality. Adrenals/Urinary Tract: Adrenal glands are unremarkable. Kidneys are normal, without renal calculi, focal lesion, or hydronephrosis. Bladder is unremarkable. Stomach/Bowel: Stomach is within normal limits. Appendix appears normal. No evidence of bowel wall thickening, distention, or inflammatory changes. Air-fluid levels within the small bowel and colon as can be seen with enterocolitis. No pneumatosis, pneumoperitoneum or portal venous gas. Vascular/Lymphatic: No significant vascular findings are present. No enlarged abdominal or pelvic lymph nodes. Reproductive: Uterus and bilateral adnexa are unremarkable. Other: No abdominal wall hernia or abnormality. No abdominopelvic ascites. Musculoskeletal: No acute osseous abnormality. No aggressive osseous lesion. IMPRESSION: 1. Air-fluid levels within the small bowel and colon as can be seen with enterocolitis. Electronically Signed   By: Elige KoHetal  Patel   On: 10/10/2018 19:52        Scheduled Meds: . feeding supplement (ENSURE ENLIVE)  237 mL Oral BID BM  . folic acid  1 mg Oral Daily  . metroNIDAZOLE  500 mg Oral Q12H  . mirtazapine  7.5 mg Oral QHS  . multivitamin with minerals  1 tablet Oral Daily  . nystatin  5 mL Mouth/Throat QID  . potassium chloride  40 mEq Oral Q4H  . senna  1 tablet Oral BID  . thiamine  100 mg Oral Daily   Or  . thiamine  100 mg Intravenous Daily   Continuous Infusions: . cefTRIAXone (ROCEPHIN)  IV 1 g (10/11/18  1949)  . ferric gluconate (FERRLECIT/NULECIT) IV 125 mg (10/12/18 0825)     LOS: 1 day    Time spent: 28 minutes    Alvira PhilipsEric J [place], DO [facility] Pager [phone]  If 7PM-7AM, please contact night-coverage [email or web address] Password [email or web address] 10/12/2018, 8:58 AM

## 2018-10-12 NOTE — Progress Notes (Signed)
Initial Nutrition Assessment  INTERVENTION:   Continue  Ensure Enlive po BID, each supplement provides 350 kcal and 20 grams of protein  NUTRITION DIAGNOSIS:   Increased nutrient needs related to (substance abuse) as evidenced by estimated needs.  GOAL:   Patient will meet greater than or equal to 90% of their needs  MONITOR:   PO intake, Supplement acceptance, Labs, Weight trends, I & O's  REASON FOR ASSESSMENT:   Consult Malnutrition Eval  ASSESSMENT:   38 y.o. female  with past medical history significant for anemia who presented with complaint of severe right sided flank pain associated with dysuria.  Admitted for workup of right flank pain.  Patient sleeping and didn't give much history as pretty lethargic. Pt with some difficulty swallowing d/t thrush. Has not been eating well d/t sleeping more than usual and poor appetite. Has not been drinking supplements as ordered, will continue at this time given substance abuse PTA of ETOH, and occasional cocaine and cannibis use. Per psych consult note, recommended pt Remeron and outpatient psych.  No recent weights in records. Last recorded weight from 2017, of 117 lb. Suspect pt is at risk of malnutrition given poor PO intakes, appetite and substance abuse.   Medications: Folic acid tablet daily, Remeron tablet daily, Multivitamin with minerals daily, K-DUR tablet every 4 hours, Thiamine tablet daily, IV ferric gluconate, Zofran tablet PRN  Labs reviewed: Low K, Phos  NUTRITION - FOCUSED PHYSICAL EXAM:  Patient lethargic and declined at this time.  Diet Order:   Diet Order            Diet regular Room service appropriate? Yes; Fluid consistency: Thin  Diet effective now              EDUCATION NEEDS:   Not appropriate for education at this time  Skin:  Skin Assessment: Reviewed RN Assessment  Last BM:  PTA  Height:   Ht Readings from Last 1 Encounters:  10/11/18 5\' 3"  (1.6 m)    Weight:   Wt Readings from  Last 1 Encounters:  10/11/18 50.6 kg    Ideal Body Weight:  52.3 kg  BMI:  Body mass index is 19.77 kg/m.  Estimated Nutritional Needs:   Kcal:  1500-1700  Protein:  70-80g  Fluid:  1.7L/day  Tilda Franco, MS, RD, LDN [facility] Inpatient Clinical Dietitian Pager: [phone] After Hours Pager: [phone]

## 2018-10-12 NOTE — Clinical Social Work Note (Signed)
Clinical Social Work Assessment  Patient Details  Name: Kathy Willis MRN: [id number] Date of Birth: 1980/11/14  Date of referral:  10/12/18               Reason for consult:  Mental Health Concerns                Permission sought to share information with:    Permission granted to share information::  No  Name::        Agency::     Relationship::     Contact Information:     Housing/Transportation Living arrangements for the past 2 months:  Apartment Source of Information:  Patient Patient Interpreter Needed:  None Criminal Activity/Legal Involvement Pertinent to Current Situation/Hospitalization:  No - Comment as needed Significant Relationships:  Friend Lives with:  Friends Do you feel safe going back to the place where you live?  Yes Need for family participation in patient care:  Yes (Comment)  Care giving concerns:  Patient reports that she is currently living with a friend and should have a place of her own April 1st. Patient is currently unemployed and without insurance.    Social Worker assessment / plan:  LCSW consulted for Pych follow up.   LCSW met with patient at [facility]. No supports present. Patient was sleeping, but awakened long enough to complete assessment.   Patient states that she is on psych meds and has a PCP that prescribes them, however she could not name meds or MD. Patient is currently unemployed. 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   LCSW provided patient with a list of resources for uninsured. LCSW left follow up information on AVS.   LCSW signing off. No additional CSW needs.   Employment status:  Unemployed Forensic scientist:  Self Pay (Medicaid Pending) PT Recommendations:  Not assessed at this time Information / Referral to community resources:  Outpatient Psychiatric Care (Comment Required)(Provided list of agencies for uninsured and left f/u on AVS. )  Patient/Family's  Response to care:  Patient is thankful for resources.   Patient/Family's Understanding of and Emotional Response to Diagnosis, Current Treatment, and Prognosis:  Patient was lethargic and not engaged in assessment. Patient apologized for lack of engagement. Patient agreeable to resources and agreed that she can follow up with resources at dc.   Emotional Assessment Appearance:  Appears stated age Attitude/Demeanor/Rapport:  Lethargic Affect (typically observed):  Accepting, Calm Orientation:  Oriented to Self, Oriented to Place, Oriented to  Time, Oriented to Situation Alcohol / Substance use:  Not Applicable Psych involvement (Current and /or in the community):  No (Comment)  Discharge Needs  Concerns to be addressed:  Mental Health Concerns Readmission within the last 30 days:  No Current discharge risk:  None Barriers to Discharge:  Continued Medical Work up   [employer], LCSW 10/12/2018, 12:44 PM

## 2018-10-13 LAB — BASIC METABOLIC PANEL
Anion gap: 6 (ref 5–15)
BUN: <5 mg/dL — ABNORMAL LOW (ref 6–20)
CHLORIDE: 109 mmol/L (ref 98–111)
CO2: 23 mmol/L (ref 22–32)
Calcium: 8.4 mg/dL — ABNORMAL LOW (ref 8.9–10.3)
Creatinine, Ser: 0.6 mg/dL (ref 0.44–1.00)
GFR calc Af Amer: >60 mL/min (ref >60)
GFR calc non Af Amer: >60 mL/min (ref >60)
Glucose, Bld: 93 mg/dL (ref 70–99)
Potassium: 4.3 mmol/L (ref 3.5–5.1)
Sodium: 138 mmol/L (ref 135–145)

## 2018-10-13 LAB — CBC
HCT: 29 % — ABNORMAL LOW (ref 36.0–46.0)
Hemoglobin: 8.2 g/dL — ABNORMAL LOW (ref 12.0–15.0)
MCH: 23 pg — AB (ref 26.0–34.0)
MCHC: 28.3 g/dL — AB (ref 30.0–36.0)
MCV: 81.2 fL (ref 80.0–100.0)
Platelets: 429 10*3/uL — ABNORMAL HIGH (ref 150–400)
RBC: 3.57 MIL/uL — ABNORMAL LOW (ref 3.87–5.11)
RDW: 18.8 % — ABNORMAL HIGH (ref 11.5–15.5)
WBC: 11.2 10*3/uL — ABNORMAL HIGH (ref 4.0–10.5)
nRBC: 0 % (ref 0.0–0.2)

## 2018-10-13 LAB — GC/CHLAMYDIA PROBE AMP (~~LOC~~) NOT AT ARMC
Chlamydia: NEGATIVE
NEISSERIA GONORRHEA: NEGATIVE

## 2018-10-13 LAB — MAGNESIUM: Magnesium: 2.2 mg/dL (ref 1.7–2.4)

## 2018-10-13 MED ORDER — FERROUS SULFATE 325 (65 FE) MG PO TBEC: 325.0000 mg | DELAYED_RELEASE_TABLET | Freq: Two times a day (BID) | ORAL | 3 refills | Status: DC

## 2018-10-13 MED ORDER — MIRTAZAPINE 7.5 MG PO TABS: 7.5000 mg | ORAL_TABLET | Freq: Every day | ORAL | 0 refills | Status: AC

## 2018-10-13 MED ORDER — CEPHALEXIN 500 MG PO CAPS: 500.0000 mg | ORAL_CAPSULE | Freq: Three times a day (TID) | ORAL | 0 refills | Status: DC

## 2018-10-13 MED ORDER — POLYETHYLENE GLYCOL 3350 17 G PO PACK
17.0000 g | PACK | Freq: Once | ORAL | Status: AC
  Administered 2018-10-13: 17 g via ORAL
  Filled 2018-10-13: qty 1

## 2018-10-13 MED ORDER — ONDANSETRON HCL 4 MG PO TABS: 4.0000 mg | ORAL_TABLET | Freq: Four times a day (QID) | ORAL | 0 refills | Status: DC | PRN

## 2018-10-13 MED ORDER — METRONIDAZOLE 0.75 % VA GEL
1.0000 | Freq: Every day | VAGINAL | Status: DC
  Filled 2018-10-13: qty 70

## 2018-10-13 MED ORDER — CEPHALEXIN 500 MG PO CAPS
500.0000 mg | ORAL_CAPSULE | Freq: Two times a day (BID) | ORAL | Status: DC
  Administered 2018-10-13: 500 mg via ORAL
  Filled 2018-10-13: qty 1

## 2018-10-13 MED ORDER — ADULT MULTIVITAMIN W/MINERALS CH: 1.0000 | ORAL_TABLET | Freq: Every day | ORAL | 0 refills | Status: DC

## 2018-10-13 MED ORDER — METRONIDAZOLE 0.75 % VA GEL: 1.0000 | Freq: Every day | VAGINAL | 0 refills | Status: DC

## 2018-10-13 MED ORDER — POLYETHYLENE GLYCOL 3350 17 G PO PACK: 17.0000 g | PACK | Freq: Every day | ORAL | 0 refills | Status: DC | PRN

## 2018-10-13 MED ORDER — ENSURE ENLIVE PO LIQD: 237.0000 mL | Freq: Two times a day (BID) | ORAL | 0 refills | Status: DC

## 2018-10-13 NOTE — Care Management Note (Signed)
Case Management Note  Patient Details  Name: Kathy Willis MRN: [id number] Date of Birth: 06-18-1981  Subjective/Objective:  Acute pyelonephritis. From home. Provided w/pcp listing-encouraged [facility]-patient states she will make own appt. Provided w/[facility] prorgam for meds-informed of 1xuse/12 calendar months/select pharmacies/$3 co pay-patient voiced understanding. No further CM needs.                 Action/Plan:dc home   Expected Discharge Date:                  Expected Discharge Plan:  Home/Self Care  In-House Referral:     Discharge planning Services  CM Consult, Indigent Health Clinic, [facility]  Post Acute Care Choice:    Choice offered to:     DME Arranged:    DME Agency:     HH Arranged:    HH Agency:     Status of Service:     If discussed at [employer], dates discussed:    Additional Comments:  Lanier Clam, RN 10/13/2018, 1:02 PM

## 2018-10-13 NOTE — Care Management Note (Signed)
Case Management Note  Patient Details  Name: ANGEE FITTING MRN: [id number] Date of Birth: 1980/09/17  Subjective/Objective:  Patient will call Primary care @ [facility] for pcp appt on own-patient aware to go to [facility] pharmacy for meds @ d/c-[facility] program provided-patient aware of 1xuse/12 calendar months/$3 co pay/select pharmacies. No further CM needs.                  Action/Plan:d/c home.   Expected Discharge Date:                  Expected Discharge Plan:  Home/Self Care  In-House Referral:     Discharge planning Services  CM Consult, Indigent Health Clinic, [facility] Program  Post Acute Care Choice:    Choice offered to:     DME Arranged:    DME Agency:     HH Arranged:    HH Agency:     Status of Service:     If discussed at [employer], dates discussed:    Additional Comments:  Lanier Clam, RN 10/13/2018, 1:32 PM

## 2018-10-13 NOTE — Progress Notes (Signed)
D/ c ing to home. All d/c instructions given w/ verbal understanding. She is aware and wants  to make Appt at her PCP as well as [facility]. Nad noted.

## 2018-10-13 NOTE — Discharge Summary (Signed)
Physician Discharge Summary  MARSHAWN NINNEMAN RUE:[id number] DOB: Oct 24, 1980 DOA: 10/10/2018  PCP: Patient, No Pcp Per  Admit date: 10/10/2018 Discharge date: 10/13/2018  Admitted From: Home Disposition: Home  Recommendations for Outpatient Follow-up:  1. Follow up with PCP in 1-2 weeks 2. Follow-up at [facility] in 1 to 2 weeks 3. Please obtain BMP/CBC in one week 4. Please follow up on the following pending results: None  Home Health: None Equipment/Devices: None  Discharge Condition: Stable CODE STATUS: Full code  Hospital Course: 38 y.o.female with past medical history significant for anemia who presented with complaint of severe right sided flank pain associated with dysuria.  Patient also reports mild pelvic pain, right lower quadrant abdominal pain with malodorous urine.  Patient also notes some slight vaginal discharge/odor.  She reports continues to binge drink and smoke.  Increased stress as of late with decreased appetite and poor oral intake.  She does endorse heavy menses that last 4-5 days.  Denies any blood in her stool. States she has been feeling very down she has been sleeping most of the day stopped eating,very tearful feels very unhappy at this point denies suicidal ideations denies homicidal ideations denies being unsafe  In the ED, patient noted to be febrile to 101.5, tachycardic at 111, BP stable.  WBC count elevated 15.4. Potassium 2.7.  Abdomen/pelvis without contrast notable for air fluid levels within the small bowel and colon suspect for enterocolitis, no stones seen, no perinephric stranding appreciated.  Urinalysis notable for positive nitrite, greater than 50 WBCs.  Vaginal wet prep notable for moderate WBCs and clue cells.  Patient was started on IV ceftriaxone and Flagyl for sepsis related to UTI and bacterial vaginosis.  Hospitalist service consulted for admission.  Patient was evaluated by psychiatry and started on Remeron 7.5 mg nightly.  She was cleared  for discharge from psychiatric standpoint.  Patient to follow-up [facility] discharge.  In regards to pyelonephritis, treated with IV ceftriaxone 2/28-3/2.  Transition to Keflex the morning of discharge.  She tolerated oral medications and diet.  Discharged home on Keflex 500 mg 3 times daily for 10 more days to complete the whole course.  Was given prescription for Zofran as needed for nausea and vomiting.  Patient was evaluated by social work and Sports coach.  She was provided with resources for PCP follow-up and prescriptions.  See individual problem list below for more.  Discharge Diagnoses:  Principal Problem:   Acute pyelonephritis Active Problems:   Hypokalemia   Dehydration   Thrush   Bacterial vaginosis   Sepsis (HCC)   Anemia   Depression   Alcohol abuse  Sepsis-resolved except for mild leukocytosis is downtrending Acute cystitis without hematuria -Blood cultures negative -Urine culture was not collected on admission -Ceftriaxone 2/28-3/2 -Keflex 3/2-3/11 -Patient was given with list of PCP for follow-up.  Hypokalemia: Resolved Hypomagnesemia: Resolved  Thrush: Resolved with nystatin suspension.  HIV nonreactive.  Bacterial vaginosis:  -Started on p.o. Flagyl in-house -Discharged on MetroGel.  Iron deficiency anemia: Stable. -Discharged on multivitamin and iron sulfate. -MiraLAX for constipation.  Adjustment disorder with depression: Evaluated by psych on 2/29 and started on Remeron 7.5 mg nightly.  Discharged on the same regimen.  Patient to follow-up at [facility].   Discharge Instructions  Discharge Instructions    Diet - low sodium heart healthy   Complete by:  As directed    Discharge instructions   Complete by:  As directed    It has been a pleasure taking  care of you! You were admitted with bladder (urinary tract) infection.  You were treated with antibiotic.  We are discharging you on more of this antibiotic to take for 10 more days to  complete the total course.  Please call one of the phone numbers you were provided with to schedule hospital follow-up.  We have also started you on a new medication for your mood issue and low hemoglobin (anemia).  Please have those filled and take as prescribed.  We strongly recommend you stop using cocaine or marijuana.  Once you are discharged, your primary care physician will handle any further medical issues. Please note that NO REFILLS for any discharge medications will be authorized once you are discharged, as it is imperative that you return to your primary care physician (or establish a relationship with a primary care physician if you do not have one) for your aftercare needs so that they can reassess your need for medications and monitor your lab values. Take care,   Increase activity slowly   Complete by:  As directed      Allergies as of 10/13/2018      Reactions   Shellfish Allergy Anaphylaxis      Medication List    STOP taking these medications   cyclobenzaprine 10 MG tablet Commonly known as:  FLEXERIL   ibuprofen 600 MG tablet Commonly known as:  ADVIL,MOTRIN   ibuprofen 800 MG tablet Commonly known as:  ADVIL,MOTRIN   oxyCODONE 5 MG immediate release tablet Commonly known as:  ROXICODONE   traMADol 50 MG tablet Commonly known as:  ULTRAM     TAKE these medications   acetaminophen 325 MG tablet Commonly known as:  TYLENOL Take 650 mg by mouth every 6 (six) hours as needed for headache.   cephALEXin 500 MG capsule Commonly known as:  KEFLEX Take 1 capsule (500 mg total) by mouth 3 (three) times daily. What changed:  when to take this   feeding supplement (ENSURE ENLIVE) Liqd Take 237 mLs by mouth 2 (two) times daily between meals.   ferrous sulfate 325 (65 FE) MG EC tablet Take 1 tablet (325 mg total) by mouth 2 (two) times daily.   metroNIDAZOLE 0.75 % vaginal gel Commonly known as:  METROGEL Place 1 Applicatorful vaginally at bedtime.     mirtazapine 7.5 MG tablet Commonly known as:  REMERON Take 1 tablet (7.5 mg total) by mouth at bedtime.   multivitamin with minerals Tabs tablet Take 1 tablet by mouth daily. Start taking on:  October 14, 2018   ondansetron 4 MG tablet Commonly known as:  ZOFRAN Take 1 tablet (4 mg total) by mouth every 6 (six) hours as needed for nausea.   polyethylene glycol packet Commonly known as:  MIRALAX / GLYCOLAX Take 17 g by mouth daily as needed for mild constipation.      Follow-up Information    [facility]. Call in 1 day(s).   Specialty:  Behavioral Health Why:  Please call [facility] at discharge for appointment.  Contact information: [address] [place] [address] [phone]        [facility]. Schedule an appointment as soon as possible for a visit.   Why:  Can get meds @ [facility] pharmacy on [address] @ d/c. Contact information: [address] [place] [address]          Consultations:  Psychiatry  Procedures/Studies:  2D Echo: None obtained this admission  Ct Renal Stone  Study  Result Date: 10/10/2018 CLINICAL DATA:  Right flank pain for 2 days EXAM: CT ABDOMEN AND PELVIS WITHOUT CONTRAST TECHNIQUE: Multidetector CT imaging of the abdomen and pelvis was performed following the standard protocol without IV contrast. COMPARISON:  None. FINDINGS: Lower chest: No acute abnormality. Hepatobiliary: No focal liver abnormality is seen. No gallstones, gallbladder wall thickening, or biliary dilatation. Pancreas: Unremarkable. No pancreatic ductal dilatation or surrounding inflammatory changes. Spleen: Normal in size without focal abnormality. Adrenals/Urinary Tract: Adrenal glands are unremarkable. Kidneys are normal, without renal calculi, focal lesion, or hydronephrosis. Bladder is unremarkable. Stomach/Bowel: Stomach is within normal limits. Appendix appears normal. No evidence of bowel wall thickening, distention, or  inflammatory changes. Air-fluid levels within the small bowel and colon as can be seen with enterocolitis. No pneumatosis, pneumoperitoneum or portal venous gas. Vascular/Lymphatic: No significant vascular findings are present. No enlarged abdominal or pelvic lymph nodes. Reproductive: Uterus and bilateral adnexa are unremarkable. Other: No abdominal wall hernia or abnormality. No abdominopelvic ascites. Musculoskeletal: No acute osseous abnormality. No aggressive osseous lesion. IMPRESSION: 1. Air-fluid levels within the small bowel and colon as can be seen with enterocolitis. Electronically Signed   By: Elige Ko   On: 10/10/2018 19:52     Subjective: No major events overnight of this morning.  Complains about nausea.  No emesis.  Denies chest pain or dyspnea.  Discharge Exam: Vitals:   10/13/18 0637 10/13/18 1339  BP: 104/68 (!) 99/51  Pulse: 67 67  Resp: 16 17  Temp: 98.2 F (36.8 C) (!) 97.5 F (36.4 C)  SpO2: 98%     GENERAL: Flat affect.  No distress. HEENT: MMM.  Vision and Hearing grossly intact.  NECK: Supple.  No JVD.  LUNGS:  No IWOB. Good air movement. CTAB.  HEART:  RRR. Heart sounds normal.  ABD: Bowel sounds present. Soft. Non tender.  EXT:  no edema bilaterally.  Moves all extremities. SKIN: no apparent skin lesion.  NEURO: Awake, alert and oriented appropriately.  No gross deficit.  PSYCH: Flat affect.     The results of significant diagnostics from this hospitalization (including imaging, microbiology, ancillary and laboratory) are listed below for reference.     Microbiology: Recent Results (from the past 240 hour(s))  Wet prep, genital     Status: Abnormal   Collection Time: 10/10/18  7:15 PM  Result Value Ref Range Status   Yeast Wet Prep HPF POC NONE SEEN NONE SEEN Final   Trich, Wet Prep NONE SEEN NONE SEEN Final   Clue Cells Wet Prep HPF POC PRESENT (A) NONE SEEN Final   WBC, Wet Prep HPF POC MANY (A) NONE SEEN Final   Sperm NONE SEEN  Final     Comment: Performed at [facility], [address] [place], [place] [address]  Culture, blood (single)     Status: None (Preliminary result)   Collection Time: 10/10/18 10:12 PM  Result Value Ref Range Status   Specimen Description BLOOD RIGHT ANTECUBITAL  Final   Special Requests   Final    BOTTLES DRAWN AEROBIC ONLY Blood Culture adequate volume Performed at [facility], [address] [place], [place] [address]    Culture NO GROWTH 2 DAYS  Final   Report Status PENDING  Incomplete     Labs: BNP (last 3 results) No results for input(s): BNP in the last 8760 hours. Basic Metabolic Panel: Recent Labs  Lab 10/10/18 1833 10/10/18 2206 10/11/18 0530 10/12/18 0539 10/13/18 0445  NA  134*  --  139 137 138  K 2.7*  --  3.4* 3.4* 4.3  CL 101  --  112* 108 109  CO2 25  --  22 24 23   GLUCOSE 117*  --  99 102* 93  BUN 6  --  7 <5* <5*  CREATININE 0.71  --  0.55 0.62 0.60  CALCIUM 8.6*  --  8.1* 8.3* 8.4*  MG  --  1.6* 2.0 2.1 2.2  PHOS  --  2.2* 2.4*  --   --    Liver Function Tests: Recent Labs  Lab 10/10/18 1833 10/11/18 0530  AST 22 13*  ALT 20 15  ALKPHOS 90 68  BILITOT 0.5 0.5  PROT 7.5 5.9*  ALBUMIN 3.6 3.0*   No results for input(s): LIPASE, AMYLASE in the last 168 hours. No results for input(s): AMMONIA in the last 168 hours. CBC: Recent Labs  Lab 10/10/18 1833 10/11/18 0530 10/12/18 0539 10/13/18 0445  WBC 15.4* 12.1* 12.7* 11.2*  HGB 9.9* 8.1* 8.0* 8.2*  HCT 33.2* 28.4* 26.8* 29.0*  MCV 78.9* 81.1 79.3* 81.2  PLT 450* 367 364 429*   Cardiac Enzymes: No results for input(s): CKTOTAL, CKMB, CKMBINDEX, TROPONINI in the last 168 hours. BNP: Invalid input(s): POCBNP CBG: No results for input(s): GLUCAP in the last 168 hours. D-Dimer No results for input(s): DDIMER in the last 72 hours. Hgb A1c No results for input(s): HGBA1C in the last 72 hours. Lipid Profile No results for input(s): CHOL, HDL, LDLCALC, TRIG,  CHOLHDL, LDLDIRECT in the last 72 hours. Thyroid function studies Recent Labs    10/11/18 0530  TSH 0.854   Anemia work up Recent Labs    10/10/18 2206  VITAMINB12 154*  FOLATE 9.7  FERRITIN 13  TIBC 300  IRON 9*  RETICCTPCT 0.5   Urinalysis    Component Value Date/Time   COLORURINE YELLOW 10/10/2018 1833   APPEARANCEUR CLOUDY (A) 10/10/2018 1833   LABSPEC 1.015 10/10/2018 1833   PHURINE 6.0 10/10/2018 1833   GLUCOSEU NEGATIVE 10/10/2018 1833   HGBUR MODERATE (A) 10/10/2018 1833   BILIRUBINUR NEGATIVE 10/10/2018 1833   KETONESUR NEGATIVE 10/10/2018 1833   PROTEINUR 30 (A) 10/10/2018 1833   UROBILINOGEN 0.2 05/12/2013 1810   NITRITE POSITIVE (A) 10/10/2018 1833   LEUKOCYTESUR LARGE (A) 10/10/2018 1833   Sepsis Labs Invalid input(s): PROCALCITONIN,  WBC,  LACTICIDVEN  Time coordinating discharge: 35 minutes  SIGNED:  Almon Hercules, MD  [facility] 10/13/2018, 2:54 PM Pager [phone]  If 7PM-7AM, please contact night-coverage [email or web address] Password [email or web address]

## 2018-10-16 LAB — CULTURE, BLOOD (SINGLE)
CULTURE: NO GROWTH
Special Requests: ADEQUATE

## 2019-03-31 ENCOUNTER — Ambulatory Visit: Payer: Medicaid Other | Admitting: Obstetrics and Gynecology

## 2019-04-05 ENCOUNTER — Other Ambulatory Visit: Payer: Self-pay

## 2019-04-05 ENCOUNTER — Emergency Department (HOSPITAL_COMMUNITY)
Admission: EM | Admit: 2019-04-05 | Discharge: 2019-04-06 | Disposition: A | Discharge status: Home / Self Care | Payer: Medicaid Other | Attending: Emergency Medicine | Admitting: Emergency Medicine

## 2019-04-05 ENCOUNTER — Encounter (HOSPITAL_COMMUNITY): Payer: Self-pay | Admitting: Emergency Medicine

## 2019-04-05 DIAGNOSIS — R1084 Generalized abdominal pain: Secondary | ICD-10-CM | POA: Insufficient documentation

## 2019-04-05 DIAGNOSIS — Z79899 Other long term (current) drug therapy: Secondary | ICD-10-CM | POA: Diagnosis not present

## 2019-04-05 DIAGNOSIS — R109 Unspecified abdominal pain: Secondary | ICD-10-CM

## 2019-04-05 DIAGNOSIS — F17200 Nicotine dependence, unspecified, uncomplicated: Secondary | ICD-10-CM | POA: Insufficient documentation

## 2019-04-05 DIAGNOSIS — D649 Anemia, unspecified: Secondary | ICD-10-CM | POA: Insufficient documentation

## 2019-04-05 DIAGNOSIS — E876 Hypokalemia: Secondary | ICD-10-CM | POA: Insufficient documentation

## 2019-04-05 DIAGNOSIS — R197 Diarrhea, unspecified: Secondary | ICD-10-CM | POA: Diagnosis not present

## 2019-04-05 LAB — COMPREHENSIVE METABOLIC PANEL
ALT: 13 U/L (ref 0–44)
AST: 22 U/L (ref 15–41)
Albumin: 4.2 g/dL (ref 3.5–5.0)
Alkaline Phosphatase: 90 U/L (ref 38–126)
Anion gap: 10 (ref 5–15)
BUN: 8 mg/dL (ref 6–20)
CO2: 25 mmol/L (ref 22–32)
Calcium: 9.2 mg/dL (ref 8.9–10.3)
Chloride: 100 mmol/L (ref 98–111)
Creatinine, Ser: 0.86 mg/dL (ref 0.44–1.00)
GFR calc Af Amer: >60 mL/min (ref >60)
GFR calc non Af Amer: >60 mL/min (ref >60)
Glucose, Bld: 99 mg/dL (ref 70–99)
Potassium: 3.2 mmol/L — ABNORMAL LOW (ref 3.5–5.1)
Sodium: 135 mmol/L (ref 135–145)
Total Bilirubin: 0.3 mg/dL (ref 0.3–1.2)
Total Protein: 7.9 g/dL (ref 6.5–8.1)

## 2019-04-05 LAB — URINALYSIS, ROUTINE W REFLEX MICROSCOPIC
Bilirubin Urine: NEGATIVE
Glucose, UA: NEGATIVE mg/dL
Hgb urine dipstick: NEGATIVE
Ketones, ur: NEGATIVE mg/dL
Leukocytes,Ua: NEGATIVE
Nitrite: NEGATIVE
Protein, ur: NEGATIVE mg/dL
Specific Gravity, Urine: 1.024 (ref 1.005–1.030)
pH: 7 (ref 5.0–8.0)

## 2019-04-05 LAB — CBC
HCT: 36.9 % (ref 36.0–46.0)
Hemoglobin: 11.2 g/dL — ABNORMAL LOW (ref 12.0–15.0)
MCH: 25.4 pg — ABNORMAL LOW (ref 26.0–34.0)
MCHC: 30.4 g/dL (ref 30.0–36.0)
MCV: 83.7 fL (ref 80.0–100.0)
Platelets: 569 10*3/uL — ABNORMAL HIGH (ref 150–400)
RBC: 4.41 MIL/uL (ref 3.87–5.11)
RDW: 17.3 % — ABNORMAL HIGH (ref 11.5–15.5)
WBC: 9.1 10*3/uL (ref 4.0–10.5)
nRBC: 0 % (ref 0.0–0.2)

## 2019-04-05 LAB — I-STAT BETA HCG BLOOD, ED (MC, WL, AP ONLY): I-stat hCG, quantitative: <5 m[IU]/mL (ref <5)

## 2019-04-05 LAB — LIPASE, BLOOD: Lipase: 33 U/L (ref 11–51)

## 2019-04-05 MED ORDER — IOHEXOL 300 MG/ML  SOLN: 30.0000 mL | Freq: Once | INTRAMUSCULAR | Status: DC | PRN

## 2019-04-05 MED ORDER — SODIUM CHLORIDE 0.9 % IV BOLUS
1000.0000 mL | Freq: Once | INTRAVENOUS | Status: AC
  Administered 2019-04-05: 1000 mL via INTRAVENOUS

## 2019-04-05 MED ORDER — SODIUM CHLORIDE 0.9% FLUSH
3.0000 mL | Freq: Once | INTRAVENOUS | Status: AC
  Administered 2019-04-05: 3 mL via INTRAVENOUS

## 2019-04-05 MED ORDER — ONDANSETRON HCL 4 MG/2ML IJ SOLN
4.0000 mg | INTRAMUSCULAR | Status: DC | PRN
  Administered 2019-04-05: 4 mg via INTRAVENOUS
  Filled 2019-04-05: qty 2

## 2019-04-05 MED ORDER — MORPHINE SULFATE (PF) 4 MG/ML IV SOLN
4.0000 mg | INTRAVENOUS | Status: DC | PRN
  Administered 2019-04-05: 4 mg via INTRAVENOUS
  Filled 2019-04-05: qty 1

## 2019-04-05 MED ORDER — DICYCLOMINE HCL 10 MG/ML IM SOLN
20.0000 mg | Freq: Once | INTRAMUSCULAR | Status: AC
  Administered 2019-04-05: 20 mg via INTRAMUSCULAR
  Filled 2019-04-05: qty 2

## 2019-04-05 NOTE — ED Provider Notes (Signed)
[facility]-EMERGENCY DEPT Provider Note   CSN: [id number] Arrival date & time: 04/05/19  1911     History   Chief Complaint Chief Complaint  Patient presents with   Abdominal Pain    HPI Kathy Willis is a 38 y.o. female.     HPI  Pt was seen at 2020.  Per pt, c/o gradual onset and persistence of constant generalized abd "pain" for the past 2 days. Has been associated with multiple intermittent episodes of diarrhea. Describes the abd pain as "cramping." Describes the stools as "watery."  Denies N/V, no fevers, no back pain, no rash, no CP/SOB, no black or blood in stools, no recent abx, no sick contacts.      Past Medical History:  Diagnosis Date   Acute pyelonephritis 10/10/2018    Patient Active Problem List   Diagnosis Date Noted   Depression 10/11/2018   Alcohol abuse 10/11/2018   Hypokalemia 10/10/2018   Dehydration 10/10/2018   Thrush 10/10/2018   Acute pyelonephritis 10/10/2018   Bacterial vaginosis 10/10/2018   Sepsis (HCC) 10/10/2018   Anemia 10/10/2018    Past Surgical History:  Procedure Laterality Date   TENDON REPAIR Right 11/05/2015   Procedure: TENDON REPAIR RIGHT SMALL FINGER;  Surgeon: Dominica SeverinWilliam Gramig, MD;  Location: [facility] OR;  Service: Orthopedics;  Laterality: Right;     OB History    Gravida  9   Para  4   Term  4   Preterm      AB  4   Living  4     SAB      TAB  4   Ectopic      Multiple      Live Births               Home Medications    Prior to Admission medications   Medication Sig Start Date End Date Taking? Authorizing Provider  acetaminophen (TYLENOL) 325 MG tablet Take 650 mg by mouth every 6 (six) hours as needed for headache.    [provider]  cephALEXin (KEFLEX) 500 MG capsule Take 1 capsule (500 mg total) by mouth 3 (three) times daily. Patient not taking: Reported on 04/05/2019 10/13/18   Almon HerculesGonfa, Taye T, MD  feeding supplement, ENSURE ENLIVE, (ENSURE ENLIVE) LIQD  Take 237 mLs by mouth 2 (two) times daily between meals. Patient not taking: Reported on 04/05/2019 10/13/18   Almon HerculesGonfa, Taye T, MD  ferrous sulfate 325 (65 FE) MG EC tablet Take 1 tablet (325 mg total) by mouth 2 (two) times daily. Patient not taking: Reported on 04/05/2019 10/13/18 02/10/19  Almon HerculesGonfa, Taye T, MD  metroNIDAZOLE (METROGEL) 0.75 % vaginal gel Place 1 Applicatorful vaginally at bedtime. Patient not taking: Reported on 04/05/2019 10/13/18   Almon HerculesGonfa, Taye T, MD  mirtazapine (REMERON) 7.5 MG tablet Take 1 tablet (7.5 mg total) by mouth at bedtime. 10/13/18   Almon HerculesGonfa, Taye T, MD  Multiple Vitamin (MULTIVITAMIN WITH MINERALS) TABS tablet Take 1 tablet by mouth daily. 10/14/18   Almon HerculesGonfa, Taye T, MD  ondansetron (ZOFRAN) 4 MG tablet Take 1 tablet (4 mg total) by mouth every 6 (six) hours as needed for nausea. 10/13/18   Almon HerculesGonfa, Taye T, MD  polyethylene glycol (MIRALAX / GLYCOLAX) packet Take 17 g by mouth daily as needed for mild constipation. 10/13/18   Almon HerculesGonfa, Taye T, MD    Family History Family History  Problem Relation Age of Onset   Diabetes Neg Hx    Hypertension Neg  Hx    Cancer Neg Hx     Social History Social History   Tobacco Use   Smoking status: Current Every Day Smoker    Packs/day: 0.50   Smokeless tobacco: Never Used  Substance Use Topics   Alcohol use: Yes    Comment: occasion   Drug use: Yes    Types: Marijuana     Allergies   Shellfish allergy   Review of Systems Review of Systems ROS: Statement: All systems negative except as marked or noted in the HPI; Constitutional: Negative for fever and chills. ; ; Eyes: Negative for eye pain, redness and discharge. ; ; ENMT: Negative for ear pain, hoarseness, nasal congestion, sinus pressure and sore throat. ; ; Cardiovascular: Negative for chest pain, palpitations, diaphoresis, dyspnea and peripheral edema. ; ; Respiratory: Negative for cough, wheezing and stridor. ; ; Gastrointestinal: +diarrhea, abd pain. Negative for nausea,  vomiting, blood in stool, hematemesis, jaundice and rectal bleeding. . ; ; Genitourinary: Negative for dysuria, flank pain and hematuria. ; ; Musculoskeletal: Negative for back pain and neck pain. Negative for swelling and trauma.; ; Skin: Negative for pruritus, rash, abrasions, blisters, bruising and skin lesion.; ; Neuro: Negative for headache, lightheadedness and neck stiffness. Negative for weakness, altered level of consciousness, altered mental status, extremity weakness, paresthesias, involuntary movement, seizure and syncope.       Physical Exam Updated Vital Signs BP (!) 130/97 (BP Location: Left Arm)    Pulse 74    Temp 98 F (36.7 C) (Oral)    Resp 18    Ht 5\' 3"  (1.6 m)    Wt 49.9 kg    LMP 03/18/2019    SpO2 99%    BMI 19.49 kg/m   Physical Exam 2025: Physical examination:  Nursing notes reviewed; Vital signs and O2 SAT reviewed;  Constitutional: Well developed, Well nourished, Well hydrated, In no acute distress; Head:  Normocephalic, atraumatic; Eyes: EOMI, PERRL, No scleral icterus; ENMT: Mouth and pharynx normal, Mucous membranes moist; Neck: Supple, Full range of motion, No lymphadenopathy; Cardiovascular: Regular rate and rhythm, No gallop; Respiratory: Breath sounds clear & equal bilaterally, No wheezes.  Speaking full sentences with ease, Normal respiratory effort/excursion; Chest: Nontender, Movement normal; Abdomen: Soft, +diffuse tenderness to palp. No rebound or guarding. Nondistended, Increased bowel sounds; Genitourinary: No CVA tenderness; Extremities: Peripheral pulses normal, No tenderness, No edema, No calf edema or asymmetry.; Neuro: AA&Ox3, Major CN grossly intact.  Speech clear. No gross focal motor or sensory deficits in extremities.; Skin: Color normal, Warm, Dry.   ED Treatments / Results  Labs (all labs ordered are listed, but only abnormal results are displayed)   EKG None  Radiology   Procedures Procedures (including critical care  time)  Medications Ordered in ED Medications  sodium chloride 0.9 % bolus 1,000 mL (has no administration in time range)  dicyclomine (BENTYL) injection 20 mg (has no administration in time range)  ondansetron (ZOFRAN) injection 4 mg (has no administration in time range)  morphine 4 MG/ML injection 4 mg (has no administration in time range)  sodium chloride flush (NS) 0.9 % injection 3 mL (3 mLs Intravenous Given 04/05/19 1947)     Initial Impression / Assessment and Plan / ED Course  I have reviewed the triage vital signs and the nursing notes.  Pertinent labs & imaging results that were available during my care of the patient were reviewed by me and considered in my medical decision making (see chart for details).  MDM Reviewed: previous chart, nursing note and vitals Reviewed previous: labs Interpretation: labs   Results for orders placed or performed during the hospital encounter of 04/05/19  Lipase, blood  Result Value Ref Range   Lipase 33 11 - 51 U/L  Comprehensive metabolic panel  Result Value Ref Range   Sodium 135 135 - 145 mmol/L   Potassium 3.2 (L) 3.5 - 5.1 mmol/L   Chloride 100 98 - 111 mmol/L   CO2 25 22 - 32 mmol/L   Glucose, Bld 99 70 - 99 mg/dL   BUN 8 6 - 20 mg/dL   Creatinine, Ser 0.86 0.44 - 1.00 mg/dL   Calcium 9.2 8.9 - 10.3 mg/dL   Total Protein 7.9 6.5 - 8.1 g/dL   Albumin 4.2 3.5 - 5.0 g/dL   AST 22 15 - 41 U/L   ALT 13 0 - 44 U/L   Alkaline Phosphatase 90 38 - 126 U/L   Total Bilirubin 0.3 0.3 - 1.2 mg/dL   GFR calc non Af Amer >60 >60 mL/min   GFR calc Af Amer >60 >60 mL/min   Anion gap 10 5 - 15  CBC  Result Value Ref Range   WBC 9.1 4.0 - 10.5 K/uL   RBC 4.41 3.87 - 5.11 MIL/uL   Hemoglobin 11.2 (L) 12.0 - 15.0 g/dL   HCT 36.9 36.0 - 46.0 %   MCV 83.7 80.0 - 100.0 fL   MCH 25.4 (L) 26.0 - 34.0 pg   MCHC 30.4 30.0 - 36.0 g/dL   RDW 17.3 (H) 11.5 - 15.5 %   Platelets 569 (H) 150 - 400 K/uL   nRBC 0.0 0.0 - 0.2 %  Urinalysis,  Routine w reflex microscopic  Result Value Ref Range   Color, Urine YELLOW YELLOW   APPearance CLEAR CLEAR   Specific Gravity, Urine 1.024 1.005 - 1.030   pH 7.0 5.0 - 8.0   Glucose, UA NEGATIVE NEGATIVE mg/dL   Hgb urine dipstick NEGATIVE NEGATIVE   Bilirubin Urine NEGATIVE NEGATIVE   Ketones, ur NEGATIVE NEGATIVE mg/dL   Protein, ur NEGATIVE NEGATIVE mg/dL   Nitrite NEGATIVE NEGATIVE   Leukocytes,Ua NEGATIVE NEGATIVE  I-Stat beta hCG blood, ED  Result Value Ref Range   I-stat hCG, quantitative <5.0 <5 mIU/mL   Comment 3            TEALE GOODGAME was evaluated in Emergency Department on 04/05/2019 for the symptoms described in the history of present illness. She was evaluated in the context of the global COVID-19 pandemic, which necessitated consideration that the patient might be at risk for infection with the SARS-CoV-2 virus that causes COVID-19. Institutional protocols and algorithms that pertain to the evaluation of patients at risk for COVID-19 are in a state of rapid change based on information released by regulatory bodies including the CDC and federal and state organizations. These policies and algorithms were followed during the patient's care in the ED.    2310:  CT A/P pending. Dispo based on results. Sign out to Dr. Roxanne Mins.     Final Clinical Impressions(s) / ED Diagnoses   Final diagnoses:  None    ED Discharge Orders    None       Francine Graven, DO 04/05/19 2313

## 2019-04-05 NOTE — ED Triage Notes (Signed)
Pt reports having abdominal pain for the last 2 days and has had over 10 episodes of diarrhea.

## 2019-04-06 ENCOUNTER — Encounter (HOSPITAL_COMMUNITY): Payer: Self-pay

## 2019-04-06 ENCOUNTER — Emergency Department (HOSPITAL_COMMUNITY): Payer: Medicaid Other

## 2019-04-06 MED ORDER — POTASSIUM CHLORIDE CRYS ER 20 MEQ PO TBCR: 20.0000 meq | EXTENDED_RELEASE_TABLET | Freq: Two times a day (BID) | ORAL | 0 refills | Status: DC

## 2019-04-06 MED ORDER — SODIUM CHLORIDE (PF) 0.9 % IJ SOLN
INTRAMUSCULAR | Status: AC
  Filled 2019-04-06: qty 50

## 2019-04-06 MED ORDER — POTASSIUM CHLORIDE CRYS ER 20 MEQ PO TBCR
40.0000 meq | EXTENDED_RELEASE_TABLET | Freq: Once | ORAL | Status: AC
  Administered 2019-04-06: 40 meq via ORAL
  Filled 2019-04-06: qty 2

## 2019-04-06 MED ORDER — LOPERAMIDE HCL 2 MG PO CAPS
4.0000 mg | ORAL_CAPSULE | Freq: Once | ORAL | Status: AC
  Administered 2019-04-06: 4 mg via ORAL
  Filled 2019-04-06: qty 2

## 2019-04-06 MED ORDER — IOHEXOL 300 MG/ML  SOLN
100.0000 mL | Freq: Once | INTRAMUSCULAR | Status: AC | PRN
  Administered 2019-04-06: 100 mL via INTRAVENOUS

## 2019-04-06 NOTE — ED Notes (Signed)
Pt called out wanting to know why its taking so long to have a Ct scan, informed they are backed up

## 2019-04-06 NOTE — ED Provider Notes (Signed)
Care assumed from Dr. Thurnell Garbe, patient with diarrhea and abdominal pain pending CT of abdomen and pelvis.  Her labs had shown mild anemia which is actually improved compared with baseline, and mild hypokalemia.  CT of abdomen and pelvis showed no acute process, presence of fibroid uterus noted.  Patient was advised of these findings.  Exam is repeated and abdomen is benign.  She is given a dose of oral potassium and sent home with prescription for [facility]. [facility].  She had taken 2 doses of loperamide 2 mg at home, she is given a dose of loperamide 4 mg here and advised to continue taking loperamide as needed and supplement with Pepto-Bismol as needed.  Return precautions discussed.  Results for orders placed or performed during the hospital encounter of 04/05/19  Lipase, blood  Result Value Ref Range   Lipase 33 11 - 51 U/L  Comprehensive metabolic panel  Result Value Ref Range   Sodium 135 135 - 145 mmol/L   Potassium 3.2 (L) 3.5 - 5.1 mmol/L   Chloride 100 98 - 111 mmol/L   CO2 25 22 - 32 mmol/L   Glucose, Bld 99 70 - 99 mg/dL   BUN 8 6 - 20 mg/dL   Creatinine, Ser 0.86 0.44 - 1.00 mg/dL   Calcium 9.2 8.9 - 10.3 mg/dL   Total Protein 7.9 6.5 - 8.1 g/dL   Albumin 4.2 3.5 - 5.0 g/dL   AST 22 15 - 41 U/L   ALT 13 0 - 44 U/L   Alkaline Phosphatase 90 38 - 126 U/L   Total Bilirubin 0.3 0.3 - 1.2 mg/dL   GFR calc non Af Amer >60 >60 mL/min   GFR calc Af Amer >60 >60 mL/min   Anion gap 10 5 - 15  CBC  Result Value Ref Range   WBC 9.1 4.0 - 10.5 K/uL   RBC 4.41 3.87 - 5.11 MIL/uL   Hemoglobin 11.2 (L) 12.0 - 15.0 g/dL   HCT 36.9 36.0 - 46.0 %   MCV 83.7 80.0 - 100.0 fL   MCH 25.4 (L) 26.0 - 34.0 pg   MCHC 30.4 30.0 - 36.0 g/dL   RDW 17.3 (H) 11.5 - 15.5 %   Platelets 569 (H) 150 - 400 K/uL   nRBC 0.0 0.0 - 0.2 %  Urinalysis, Routine w reflex microscopic  Result Value Ref Range   Color, Urine YELLOW YELLOW   APPearance CLEAR CLEAR   Specific Gravity, Urine 1.024 1.005 - 1.030   pH 7.0 5.0 -  8.0   Glucose, UA NEGATIVE NEGATIVE mg/dL   Hgb urine dipstick NEGATIVE NEGATIVE   Bilirubin Urine NEGATIVE NEGATIVE   Ketones, ur NEGATIVE NEGATIVE mg/dL   Protein, ur NEGATIVE NEGATIVE mg/dL   Nitrite NEGATIVE NEGATIVE   Leukocytes,Ua NEGATIVE NEGATIVE  I-Stat beta hCG blood, ED  Result Value Ref Range   I-stat hCG, quantitative <5.0 <5 mIU/mL   Comment 3           Ct Abdomen Pelvis W Contrast  Result Date: 04/06/2019 CLINICAL DATA:  Abdominal pain with diarrhea EXAM: CT ABDOMEN AND PELVIS WITH CONTRAST TECHNIQUE: Multidetector CT imaging of the abdomen and pelvis was performed using the standard protocol following bolus administration of intravenous contrast. CONTRAST:  123mL OMNIPAQUE IOHEXOL 300 MG/ML  SOLN COMPARISON:  CT 10/10/2018 FINDINGS: Lower chest: Lung bases demonstrate no acute consolidation or effusion. The heart size is normal. Hepatobiliary: No focal liver abnormality is seen. No gallstones, gallbladder wall thickening, or biliary dilatation. Pancreas:  Unremarkable. No pancreatic ductal dilatation or surrounding inflammatory changes. Spleen: Normal in size without focal abnormality. Adrenals/Urinary Tract: Adrenal glands are unremarkable. Kidneys are normal, without renal calculi, focal lesion, or hydronephrosis. Bladder is unremarkable. Stomach/Bowel: The stomach is unremarkable. No dilated small bowel. Negative appendix. Fluid within the colon. No bowel wall thickening. Vascular/Lymphatic: No significant vascular findings are present. No enlarged abdominal or pelvic lymph nodes. Reproductive: 2.7 cm enhancing mass within the right anterior uterus with adjacent rim enhancing mass presumably fibroids. No adnexal mass. Other: No abdominal wall hernia or abnormality. Tiny free fluid in the pelvis. Musculoskeletal: No acute or significant osseous findings. IMPRESSION: 1. No CT evidence for acute intra-abdominal or pelvic abnormality. Fluid within the colon as may be seen with diarrheal  illness. 2. Fibroid uterus.  Trace free fluid in the pelvis Electronically Signed   By: Jasmine PangKim  Fujinaga M.D.   On: 04/06/2019 01:16      Dione BoozeGlick, Nazario Russom, MD 04/06/19 [phone]

## 2019-04-06 NOTE — Discharge Instructions (Addendum)
Take loperamide as needed.  Take Pepto Bismol as needed.  Return if symptoms are getting worse.

## 2019-04-09 ENCOUNTER — Encounter (HOSPITAL_COMMUNITY): Payer: Self-pay

## 2019-04-09 ENCOUNTER — Emergency Department (HOSPITAL_COMMUNITY)
Admission: EM | Admit: 2019-04-09 | Discharge: 2019-04-09 | Disposition: A | Discharge status: Home / Self Care | Payer: Medicaid Other | Attending: Emergency Medicine | Admitting: Emergency Medicine

## 2019-04-09 ENCOUNTER — Other Ambulatory Visit: Payer: Self-pay

## 2019-04-09 DIAGNOSIS — F17210 Nicotine dependence, cigarettes, uncomplicated: Secondary | ICD-10-CM | POA: Insufficient documentation

## 2019-04-09 DIAGNOSIS — R197 Diarrhea, unspecified: Secondary | ICD-10-CM | POA: Diagnosis not present

## 2019-04-09 DIAGNOSIS — R109 Unspecified abdominal pain: Secondary | ICD-10-CM | POA: Diagnosis not present

## 2019-04-09 LAB — CBC
HCT: 36.6 % (ref 36.0–46.0)
Hemoglobin: 11.1 g/dL — ABNORMAL LOW (ref 12.0–15.0)
MCH: 25.8 pg — ABNORMAL LOW (ref 26.0–34.0)
MCHC: 30.3 g/dL (ref 30.0–36.0)
MCV: 84.9 fL (ref 80.0–100.0)
Platelets: 536 10*3/uL — ABNORMAL HIGH (ref 150–400)
RBC: 4.31 MIL/uL (ref 3.87–5.11)
RDW: 17.7 % — ABNORMAL HIGH (ref 11.5–15.5)
WBC: 10.7 10*3/uL — ABNORMAL HIGH (ref 4.0–10.5)
nRBC: 0 % (ref 0.0–0.2)

## 2019-04-09 LAB — URINALYSIS, ROUTINE W REFLEX MICROSCOPIC
Bilirubin Urine: NEGATIVE
Glucose, UA: NEGATIVE mg/dL
Hgb urine dipstick: NEGATIVE
Ketones, ur: NEGATIVE mg/dL
Leukocytes,Ua: NEGATIVE
Nitrite: NEGATIVE
Protein, ur: NEGATIVE mg/dL
Specific Gravity, Urine: 1.02 (ref 1.005–1.030)
pH: 5 (ref 5.0–8.0)

## 2019-04-09 LAB — LIPASE, BLOOD: Lipase: 29 U/L (ref 11–51)

## 2019-04-09 LAB — I-STAT BETA HCG BLOOD, ED (MC, WL, AP ONLY): I-stat hCG, quantitative: <5 m[IU]/mL (ref <5)

## 2019-04-09 LAB — COMPREHENSIVE METABOLIC PANEL
ALT: 14 U/L (ref 0–44)
AST: 21 U/L (ref 15–41)
Albumin: 4.1 g/dL (ref 3.5–5.0)
Alkaline Phosphatase: 74 U/L (ref 38–126)
Anion gap: 6 (ref 5–15)
BUN: 8 mg/dL (ref 6–20)
CO2: 24 mmol/L (ref 22–32)
Calcium: 9.1 mg/dL (ref 8.9–10.3)
Chloride: 109 mmol/L (ref 98–111)
Creatinine, Ser: 0.85 mg/dL (ref 0.44–1.00)
GFR calc Af Amer: >60 mL/min (ref >60)
GFR calc non Af Amer: >60 mL/min (ref >60)
Glucose, Bld: 106 mg/dL — ABNORMAL HIGH (ref 70–99)
Potassium: 3.3 mmol/L — ABNORMAL LOW (ref 3.5–5.1)
Sodium: 139 mmol/L (ref 135–145)
Total Bilirubin: 0.6 mg/dL (ref 0.3–1.2)
Total Protein: 7.5 g/dL (ref 6.5–8.1)

## 2019-04-09 MED ORDER — SODIUM CHLORIDE 0.9 % IV BOLUS
1000.0000 mL | Freq: Once | INTRAVENOUS | Status: AC
  Administered 2019-04-09: 14:00:00 1000 mL via INTRAVENOUS

## 2019-04-09 MED ORDER — DICYCLOMINE HCL 10 MG/ML IM SOLN
20.0000 mg | Freq: Once | INTRAMUSCULAR | Status: AC
  Administered 2019-04-09: 14:00:00 20 mg via INTRAMUSCULAR
  Filled 2019-04-09: qty 2

## 2019-04-09 MED ORDER — HYDROMORPHONE HCL 1 MG/ML IJ SOLN
0.5000 mg | Freq: Once | INTRAMUSCULAR | Status: AC
  Administered 2019-04-09: 14:00:00 0.5 mg via INTRAVENOUS
  Filled 2019-04-09: qty 1

## 2019-04-09 MED ORDER — KETOROLAC TROMETHAMINE 15 MG/ML IJ SOLN
15.0000 mg | Freq: Once | INTRAMUSCULAR | Status: AC
  Administered 2019-04-09: 15 mg via INTRAVENOUS
  Filled 2019-04-09: qty 1

## 2019-04-09 MED ORDER — SODIUM CHLORIDE 0.9% FLUSH: 3.0000 mL | Freq: Once | INTRAVENOUS | Status: DC

## 2019-04-09 MED ORDER — LOPERAMIDE HCL 2 MG PO CAPS
4.0000 mg | ORAL_CAPSULE | Freq: Once | ORAL | Status: AC
  Administered 2019-04-09: 15:00:00 4 mg via ORAL
  Filled 2019-04-09: qty 2

## 2019-04-09 MED ORDER — DICYCLOMINE HCL 20 MG PO TABS: 20.0000 mg | ORAL_TABLET | Freq: Four times a day (QID) | ORAL | 0 refills | Status: AC | PRN

## 2019-04-09 NOTE — ED Triage Notes (Signed)
Patient c/o mid abdominal pain and diarrhea x 6 days. Patient states she was seen in the ED 4 days ago and was told to take Imodium. Patient states she began vomiting the Imodium after taking this AM.

## 2019-04-09 NOTE — ED Provider Notes (Signed)
[facility] DEPT Provider Note   CSN: [id number] Arrival date & time: 04/09/19  1105     History   Chief Complaint Chief Complaint  Patient presents with  . Abdominal Pain  . Diarrhea  . Emesis    HPI Kathy BOISSELLE is a 38 y.o. female.     HPI   38 year old female with abdominal pain and diarrhea.  Symptom onset about 1 week ago.  She was seen in the emergency room 4 days ago for similar symptoms.  Work-up was significant for hypokalemia.  It was supplemented.  She had a CT of the abdomen and pelvis during that visit that was fairly unremarkable.  She advised to  take Imodium.  She states that the following day she began feeling better but then had return of symptoms.  She now feels like they are worse than they were previously.  Waxing and waning pain.  She gets proximal-isms of more severe pain that "feels like contractions during childbirth."  No blood in her stool.  No rectal pain. In past day has also been having n/v when she tries to eat or drink.  No sick contactss he is aware of.  No well water usage.  No recent antibiotic usage.  No segment recent travel history.  Past Medical History:  Diagnosis Date  . Acute pyelonephritis 10/10/2018    Patient Active Problem List   Diagnosis Date Noted  . Depression 10/11/2018  . Alcohol abuse 10/11/2018  . Hypokalemia 10/10/2018  . Dehydration 10/10/2018  . Thrush 10/10/2018  . Acute pyelonephritis 10/10/2018  . Bacterial vaginosis 10/10/2018  . Sepsis ([place]) 10/10/2018  . Anemia 10/10/2018    Past Surgical History:  Procedure Laterality Date  . TENDON REPAIR Right 11/05/2015   Procedure: TENDON REPAIR RIGHT SMALL FINGER;  Surgeon: Roseanne Kaufman, MD;  Location: [facility];  Service: Orthopedics;  Laterality: Right;     OB History    Gravida  9   Para  4   Term  4   Preterm      AB  4   Living  4     SAB      TAB  4   Ectopic      Multiple      Live Births               Home Medications    Prior to Admission medications   Medication Sig Start Date End Date Taking? Authorizing Provider  loperamide (IMODIUM A-D) 2 MG tablet Take 2 mg by mouth 4 (four) times daily as needed for diarrhea or loose stools.   Yes [provider]  potassium chloride SA (K-DUR) 20 MEQ tablet Take 1 tablet (20 mEq total) by mouth 2 (two) times daily. 9/48/54  Yes Delora Fuel, MD  mirtazapine (REMERON) 7.5 MG tablet Take 1 tablet (7.5 mg total) by mouth at bedtime. Patient not taking: Reported on 04/09/2019 10/13/18   Mercy Riding, MD  ferrous sulfate 325 (65 FE) MG EC tablet Take 1 tablet (325 mg total) by mouth 2 (two) times daily. Patient not taking: Reported on 04/05/2019 10/13/18 04/06/19  Mercy Riding, MD    Family History Family History  Problem Relation Age of Onset  . Healthy Mother   . Healthy Father   . Diabetes Neg Hx   . Hypertension Neg Hx   . Cancer Neg Hx     Social History Social History   Tobacco Use  . Smoking status: Current  Every Day Smoker    Packs/day: 0.50  . Smokeless tobacco: Never Used  Substance Use Topics  . Alcohol use: Yes    Comment: occasion  . Drug use: Yes    Types: Marijuana    Comment: daily use     Allergies   Shellfish allergy   Review of Systems Review of Systems  All systems reviewed and negative, other than as noted in HPI.  Physical Exam Updated Vital Signs BP 121/64 (BP Location: Left Arm)   Pulse 73   Temp 98.3 F (36.8 C) (Oral)   Resp 18   Ht 5\' 3"  (1.6 m)   Wt 49.9 kg   LMP 03/18/2019 Comment: negative beta HCG 04/05/19  SpO2 100%   BMI 19.49 kg/m   Physical Exam Vitals signs and nursing note reviewed.  Constitutional:      Appearance: She is well-developed.     Comments: Uncomfortable appearing but not toxic.   HENT:     Head: Normocephalic and atraumatic.  Eyes:     General:        Right eye: No discharge.        Left eye: No discharge.     Conjunctiva/sclera: Conjunctivae normal.   Neck:     Musculoskeletal: Neck supple.  Cardiovascular:     Rate and Rhythm: Normal rate and regular rhythm.     Heart sounds: Normal heart sounds. No murmur. No friction rub. No gallop.   Pulmonary:     Effort: Pulmonary effort is normal. No respiratory distress.     Breath sounds: Normal breath sounds.  Abdominal:     General: There is no distension.     Palpations: Abdomen is soft.     Tenderness: There is no abdominal tenderness.  Musculoskeletal:        General: No tenderness.  Skin:    General: Skin is warm and dry.  Neurological:     Mental Status: She is alert.  Psychiatric:        Behavior: Behavior normal.        Thought Content: Thought content normal.      ED Treatments / Results  Labs (all labs ordered are listed, but only abnormal results are displayed) Labs Reviewed  COMPREHENSIVE METABOLIC PANEL - Abnormal; Notable for the following components:      Result Value   Potassium 3.3 (*)    Glucose, Bld 106 (*)    All other components within normal limits  CBC - Abnormal; Notable for the following components:   WBC 10.7 (*)    Hemoglobin 11.1 (*)    MCH 25.8 (*)    RDW 17.7 (*)    Platelets 536 (*)    All other components within normal limits  GASTROINTESTINAL PANEL BY PCR, STOOL (REPLACES STOOL CULTURE)  LIPASE, BLOOD  URINALYSIS, ROUTINE W REFLEX MICROSCOPIC  I-STAT BETA HCG BLOOD, ED ([facility], [facility], [facility] ONLY)    EKG None  Radiology No results found.  Procedures Procedures (including critical care time)  Medications Ordered in ED Medications  sodium chloride flush (NS) 0.9 % injection 3 mL (has no administration in time range)  sodium chloride 0.9 % bolus 1,000 mL (has no administration in time range)  dicyclomine (BENTYL) injection 20 mg (has no administration in time range)  ketorolac (TORADOL) 15 MG/ML injection 15 mg (has no administration in time range)  HYDROmorphone (DILAUDID) injection 0.5 mg (has no administration in time range)      Initial Impression / Assessment and Plan /  ED Course  I have reviewed the triage vital signs and the nursing notes.  Pertinent labs & imaging results that were available during my care of the patient were reviewed by me and considered in my medical decision making (see chart for details).       37yF with continued diarrhea. Unable to provide stool specimen. Abdominal exam fairly benign. Prior imaging reviewed. I do not think needs repeat today. Afebrile. Nonbloody stool. Just minimal elevation in WBC. Deferred abx tx at this time. Plan continued symptomatic tx. Advised pt of return precautions and recommendation for stool studies if symptoms persist.   Final Clinical Impressions(s) / ED Diagnoses   Final diagnoses:  Diarrhea, unspecified type    ED Discharge Orders         Ordered    dicyclomine (BENTYL) 20 MG tablet  Every 6 hours PRN     04/09/19 1643           Raeford RazorKohut, Ursala Cressy, MD 04/10/19 1001

## 2019-04-09 NOTE — ED Notes (Signed)
ED Provider at bedside. 

## 2019-04-09 NOTE — ED Notes (Signed)
Patient taken to bathroom with use of wheelchair.

## 2020-03-29 ENCOUNTER — Emergency Department (HOSPITAL_COMMUNITY): Payer: Medicaid Other

## 2020-03-29 ENCOUNTER — Encounter (HOSPITAL_COMMUNITY): Payer: Self-pay | Admitting: Emergency Medicine

## 2020-03-29 ENCOUNTER — Other Ambulatory Visit: Payer: Self-pay

## 2020-03-29 ENCOUNTER — Emergency Department (HOSPITAL_COMMUNITY)
Admission: EM | Admit: 2020-03-29 | Discharge: 2020-03-30 | Disposition: A | Discharge status: Home / Self Care | Payer: Medicaid Other | Attending: Emergency Medicine | Admitting: Emergency Medicine

## 2020-03-29 DIAGNOSIS — J069 Acute upper respiratory infection, unspecified: Secondary | ICD-10-CM | POA: Insufficient documentation

## 2020-03-29 DIAGNOSIS — Z20822 Contact with and (suspected) exposure to covid-19: Secondary | ICD-10-CM | POA: Insufficient documentation

## 2020-03-29 DIAGNOSIS — Z1152 Encounter for screening for COVID-19: Secondary | ICD-10-CM | POA: Insufficient documentation

## 2020-03-29 DIAGNOSIS — R001 Bradycardia, unspecified: Secondary | ICD-10-CM | POA: Insufficient documentation

## 2020-03-29 DIAGNOSIS — Z79899 Other long term (current) drug therapy: Secondary | ICD-10-CM | POA: Diagnosis not present

## 2020-03-29 DIAGNOSIS — F17200 Nicotine dependence, unspecified, uncomplicated: Secondary | ICD-10-CM | POA: Insufficient documentation

## 2020-03-29 DIAGNOSIS — R0602 Shortness of breath: Secondary | ICD-10-CM | POA: Diagnosis present

## 2020-03-29 LAB — SARS CORONAVIRUS 2 BY RT PCR (HOSPITAL ORDER, PERFORMED IN ~~LOC~~ HOSPITAL LAB): SARS Coronavirus 2: NEGATIVE

## 2020-03-29 MED ORDER — DOXYCYCLINE HYCLATE 100 MG PO CAPS: 100.0000 mg | ORAL_CAPSULE | Freq: Two times a day (BID) | ORAL | 0 refills | Status: AC

## 2020-03-29 MED ORDER — IBUPROFEN 600 MG PO TABS: 600.0000 mg | ORAL_TABLET | Freq: Three times a day (TID) | ORAL | 0 refills | Status: AC | PRN

## 2020-03-29 MED ORDER — IBUPROFEN 400 MG PO TABS
600.0000 mg | ORAL_TABLET | Freq: Once | ORAL | Status: AC
  Administered 2020-03-29: 600 mg via ORAL
  Filled 2020-03-29: qty 1

## 2020-03-29 NOTE — ED Provider Notes (Signed)
[facility] EMERGENCY DEPARTMENT Provider Note   CSN: [id number] Arrival date & time: 03/29/20  1517     History Chief Complaint  Patient presents with  . Cough  . Shortness of Breath    Kathy Willis is a 39 y.o. female.  She is here with a complaint of 2 days of runny nose sore throat cough nonproductive shortness of breath chills body aches.  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  Has not had her Covid vaccine.  Unknown if any fevers.  The history is provided by the patient.  Cough Cough characteristics:  Non-productive Severity:  Moderate Onset quality:  Gradual Duration:  2 days Timing:  Intermittent Progression:  Unchanged Chronicity:  New Smoker: yes   Context: sick contacts   Relieved by:  Nothing Worsened by:  Deep breathing and activity Ineffective treatments:  None tried Associated symptoms: chest pain, chills, headaches, myalgias, rhinorrhea, shortness of breath, sinus congestion and sore throat   Associated symptoms: no eye discharge, no fever, no rash and no wheezing   Risk factors: no recent travel   Shortness of Breath Associated symptoms: chest pain, cough, headaches and sore throat   Associated symptoms: no fever, no rash, no vomiting and no wheezing        Past Medical History:  Diagnosis Date  . Acute pyelonephritis 10/10/2018    Patient Active Problem List   Diagnosis Date Noted  . Depression 10/11/2018  . Alcohol abuse 10/11/2018  . Hypokalemia 10/10/2018  . Dehydration 10/10/2018  . Thrush 10/10/2018  . Acute pyelonephritis 10/10/2018  . Bacterial vaginosis 10/10/2018  . Sepsis (HCC) 10/10/2018  . Anemia 10/10/2018    Past Surgical History:  Procedure Laterality Date  . TENDON REPAIR Right 11/05/2015   Procedure: TENDON REPAIR RIGHT SMALL FINGER;  Surgeon: Dominica Severin, MD;  Location: [facility] OR;  Service: Orthopedics;  Laterality: Right;     OB History    Gravida  9   Para  4   Term  4    Preterm      AB  4   Living  4     SAB      TAB  4   Ectopic      Multiple      Live Births              Family History  Problem Relation Age of Onset  . Healthy Mother   . Healthy Father   . Diabetes Neg Hx   . Hypertension Neg Hx   . Cancer Neg Hx     Social History   Tobacco Use  . Smoking status: Current Every Day Smoker    Packs/day: 0.50  . Smokeless tobacco: Never Used  Vaping Use  . Vaping Use: Never used  Substance Use Topics  . Alcohol use: Yes    Comment: occasion  . Drug use: Yes    Types: Marijuana    Comment: daily use    Home Medications Prior to Admission medications   Medication Sig Start Date End Date Taking? Authorizing Provider  dicyclomine (BENTYL) 20 MG tablet Take 1 tablet (20 mg total) by mouth every 6 (six) hours as needed for spasms. 04/09/19   Raeford Razor, MD  loperamide (IMODIUM A-D) 2 MG tablet Take 2 mg by mouth 4 (four) times daily as needed for diarrhea or loose stools.    [provider]  mirtazapine (REMERON) 7.5 MG tablet Take 1 tablet (7.5 mg total)  by mouth at bedtime. Patient not taking: Reported on 04/09/2019 10/13/18   Almon Hercules, MD  potassium chloride SA (K-DUR) 20 MEQ tablet Take 1 tablet (20 mEq total) by mouth 2 (two) times daily. 04/06/19   Dione Booze, MD  ferrous sulfate 325 (65 FE) MG EC tablet Take 1 tablet (325 mg total) by mouth 2 (two) times daily. Patient not taking: Reported on 04/05/2019 10/13/18 04/06/19  Almon Hercules, MD    Allergies    Shellfish allergy  Review of Systems   Review of Systems  Constitutional: Positive for chills. Negative for fever.  HENT: Positive for rhinorrhea and sore throat.   Eyes: Negative for discharge and visual disturbance.  Respiratory: Positive for cough and shortness of breath. Negative for wheezing.   Cardiovascular: Positive for chest pain.  Gastrointestinal: Negative for vomiting.  Genitourinary: Negative for dysuria.  Musculoskeletal: Positive for  myalgias.  Skin: Negative for rash.  Neurological: Positive for headaches.    Physical Exam Updated Vital Signs BP 133/70 (BP Location: Right Arm)   Pulse (!) 55   Temp 99.2 F (37.3 C) (Oral)   Resp 18   Ht 5\' 3"  (1.6 m)   Wt 46.7 kg   SpO2 100%   BMI 18.25 kg/m   Physical Exam Vitals and nursing note reviewed.  Constitutional:      General: She is not in acute distress.    Appearance: Normal appearance. She is well-developed.  HENT:     Head: Normocephalic and atraumatic.     Nose: Rhinorrhea present.     Mouth/Throat:     Mouth: Mucous membranes are moist.     Pharynx: Oropharynx is clear. No oropharyngeal exudate or posterior oropharyngeal erythema.  Eyes:     Conjunctiva/sclera: Conjunctivae normal.  Cardiovascular:     Rate and Rhythm: Regular rhythm. Bradycardia present.     Heart sounds: No murmur heard.   Pulmonary:     Effort: Pulmonary effort is normal. No respiratory distress.     Breath sounds: Normal breath sounds.  Abdominal:     Palpations: Abdomen is soft.     Tenderness: There is no abdominal tenderness.  Musculoskeletal:        General: Normal range of motion.     Cervical back: Neck supple.     Right lower leg: No tenderness.     Left lower leg: No tenderness.  Skin:    General: Skin is warm and dry.     Capillary Refill: Capillary refill takes less than 2 seconds.  Neurological:     General: No focal deficit present.     Mental Status: She is alert.     ED Results / Procedures / Treatments   Labs (all labs ordered are listed, but only abnormal results are displayed) Labs Reviewed  SARS CORONAVIRUS 2 BY RT PCR (HOSPITAL ORDER, PERFORMED IN [facility] LAB)    EKG None  Radiology DG Chest Port 1 View  Result Date: 03/29/2020 CLINICAL DATA:  Cough and shortness of breath. EXAM: PORTABLE CHEST 1 VIEW COMPARISON:  July 09, 2017 FINDINGS: There is no evidence of acute infiltrate, pleural effusion or pneumothorax.  Bilateral radiopaque nipple piercings are seen. The heart size and mediastinal contours are within normal limits. The visualized skeletal structures are unremarkable. IMPRESSION: No active disease. Electronically Signed   By: July 11, 2017 M.D.   On: 03/29/2020 23:03    Procedures Procedures (including critical care time)  Medications Ordered in ED Medications  ibuprofen (ADVIL)  tablet 600 mg (600 mg Oral Given 03/29/20 2237)    ED Course  I have reviewed the triage vital signs and the nursing notes.  Pertinent labs & imaging results that were available during my care of the patient were reviewed by me and considered in my medical decision making (see chart for details).  Clinical Course as of Mar 30 1034  Tue Mar 29, 2020  2249 Chest x-ray interpreted by me as no acute infiltrates.   [MB]    Clinical Course User Index [MB] Terrilee Files, MD   MDM Rules/Calculators/A&P                         LUELLEN HOWSON was evaluated in Emergency Department on 03/29/2020 for the symptoms described in the history of present illness. She was evaluated in the context of the global COVID-19 pandemic, which necessitated consideration that the patient might be at risk for infection with the SARS-CoV-2 virus that causes COVID-19. Institutional protocols and algorithms that pertain to the evaluation of patients at risk for COVID-19 are in a state of rapid change based on information released by regulatory bodies including the CDC and federal and state organizations. These policies and algorithms were followed during the patient's care in the ED.  39 year old female with upper respiratory symptoms for 2 days.  No Covid vaccine.  Positive Covid exposure.  Differential includes Covid, viral URI, flu, sinusitis, bronchitis.  Chest x-ray showing no acute infiltrates.  Covid testing negative.  Will treat with Doxy.  Return instructions discussed Final Clinical Impression(s) / ED Diagnoses Final diagnoses:    Upper respiratory tract infection, unspecified type  Person under investigation for COVID-19    Rx / DC Orders ED Discharge Orders         Ordered    doxycycline (VIBRAMYCIN) 100 MG capsule  2 times daily     Discontinue  Reprint     03/29/20 2254    ibuprofen (ADVIL) 600 MG tablet  Every 8 hours PRN     Discontinue  Reprint     03/29/20 2254           Terrilee Files, MD 03/30/20 1037

## 2020-03-29 NOTE — Discharge Instructions (Addendum)
You are seen in the emergency department for 2 days of cough body aches runny nose nasal congestion sinus pressure.  You had a chest x-ray that did not show an obvious pneumonia.  Your Covid testing was pending at time of discharge and you can follow-up on the results of this in my chart.  We are prescribing you an antibiotic and some ibuprofen.  Drink plenty of fluids.  Limit tobacco.  If your Covid test is positive you will need to isolate up to 14 days.  Follow-up with your doctor and return to the emergency department for any worsening or concerning symptoms.

## 2020-03-29 NOTE — ED Triage Notes (Signed)
Patient arrives to ED with complaints of cough, shortness of breath, chills, and runny nose. Pt is unvaccinated. Was exposed to positive coworker.

## 2020-04-09 ENCOUNTER — Emergency Department (HOSPITAL_COMMUNITY)
Admission: EM | Admit: 2020-04-09 | Discharge: 2020-04-09 | Disposition: A | Discharge status: Home / Self Care | Payer: Medicaid Other | Attending: Emergency Medicine | Admitting: Emergency Medicine

## 2020-04-09 ENCOUNTER — Other Ambulatory Visit: Payer: Self-pay

## 2020-04-09 ENCOUNTER — Encounter (HOSPITAL_COMMUNITY): Payer: Self-pay | Admitting: Emergency Medicine

## 2020-04-09 ENCOUNTER — Emergency Department (HOSPITAL_COMMUNITY): Payer: Medicaid Other

## 2020-04-09 DIAGNOSIS — Z79899 Other long term (current) drug therapy: Secondary | ICD-10-CM | POA: Insufficient documentation

## 2020-04-09 DIAGNOSIS — U071 COVID-19: Secondary | ICD-10-CM | POA: Insufficient documentation

## 2020-04-09 DIAGNOSIS — F17200 Nicotine dependence, unspecified, uncomplicated: Secondary | ICD-10-CM | POA: Diagnosis not present

## 2020-04-09 DIAGNOSIS — R5383 Other fatigue: Secondary | ICD-10-CM | POA: Diagnosis present

## 2020-04-09 LAB — BASIC METABOLIC PANEL
Anion gap: 11 (ref 5–15)
BUN: 9 mg/dL (ref 6–20)
CO2: 26 mmol/L (ref 22–32)
Calcium: 9.4 mg/dL (ref 8.9–10.3)
Chloride: 104 mmol/L (ref 98–111)
Creatinine, Ser: 0.69 mg/dL (ref 0.44–1.00)
GFR calc Af Amer: >60 mL/min (ref >60)
GFR calc non Af Amer: >60 mL/min (ref >60)
Glucose, Bld: 86 mg/dL (ref 70–99)
Potassium: 3.1 mmol/L — ABNORMAL LOW (ref 3.5–5.1)
Sodium: 141 mmol/L (ref 135–145)

## 2020-04-09 LAB — URINALYSIS, ROUTINE W REFLEX MICROSCOPIC
Bacteria, UA: NONE SEEN
Bilirubin Urine: NEGATIVE
Glucose, UA: NEGATIVE mg/dL
Hgb urine dipstick: NEGATIVE
Ketones, ur: NEGATIVE mg/dL
Nitrite: NEGATIVE
Protein, ur: 30 mg/dL — AB
Specific Gravity, Urine: 1.023 (ref 1.005–1.030)
pH: 5 (ref 5.0–8.0)

## 2020-04-09 LAB — CBC
HCT: 31.9 % — ABNORMAL LOW (ref 36.0–46.0)
Hemoglobin: 9.8 g/dL — ABNORMAL LOW (ref 12.0–15.0)
MCH: 24.1 pg — ABNORMAL LOW (ref 26.0–34.0)
MCHC: 30.7 g/dL (ref 30.0–36.0)
MCV: 78.6 fL — ABNORMAL LOW (ref 80.0–100.0)
Platelets: 388 10*3/uL (ref 150–400)
RBC: 4.06 MIL/uL (ref 3.87–5.11)
RDW: 17.5 % — ABNORMAL HIGH (ref 11.5–15.5)
WBC: 5.4 10*3/uL (ref 4.0–10.5)
nRBC: 0 % (ref 0.0–0.2)

## 2020-04-09 LAB — HCG, QUANTITATIVE, PREGNANCY: hCG, Beta Chain, Quant, S: <1 m[IU]/mL (ref <5)

## 2020-04-09 LAB — SARS CORONAVIRUS 2 BY RT PCR (HOSPITAL ORDER, PERFORMED IN ~~LOC~~ HOSPITAL LAB): SARS Coronavirus 2: POSITIVE — AB

## 2020-04-09 LAB — CBG MONITORING, ED: Glucose-Capillary: 91 mg/dL (ref 70–99)

## 2020-04-09 NOTE — Discharge Instructions (Addendum)
Person Under Monitoring Name: Kathy Willis  Location: [address] [place] [address]   Infection Prevention Recommendations for Individuals Confirmed to have, or Being Evaluated for, 2019 Novel Coronavirus (COVID-19) Infection Who Receive Care at Home  Individuals who are confirmed to have, or are being evaluated for, COVID-19 should follow the prevention steps below until a healthcare provider or local or state health department says they can return to normal activities.  Stay home except to get medical care You should restrict activities outside your home, except for getting medical care. Do not go to work, school, or public areas, and do not use public transportation or taxis.  Call ahead before visiting your doctor Before your medical appointment, call the healthcare provider and tell them that you have, or are being evaluated for, COVID-19 infection. This will help the healthcare provider's office take steps to keep other people from getting infected. Ask your healthcare provider to call the local or state health department.  Monitor your symptoms Seek prompt medical attention if your illness is worsening (e.g., difficulty breathing). Before going to your medical appointment, call the healthcare provider and tell them that you have, or are being evaluated for, COVID-19 infection. Ask your healthcare provider to call the local or state health department.  Wear a facemask You should wear a facemask that covers your nose and mouth when you are in the same room with other people and when you visit a healthcare provider. People who live with or visit you should also wear a facemask while they are in the same room with you.  Separate yourself from other people in your home As much as possible, you should stay in a different room from other people in your home. Also, you should use a separate bathroom, if available.  Avoid sharing household items You  should not share dishes, drinking glasses, cups, eating utensils, towels, bedding, or other items with other people in your home. After using these items, you should wash them thoroughly with soap and water.  Cover your coughs and sneezes Cover your mouth and nose with a tissue when you cough or sneeze, or you can cough or sneeze into your sleeve. Throw used tissues in a lined trash can, and immediately wash your hands with soap and water for at least 20 seconds or use an alcohol-based hand rub.  Wash your [employer] your hands often and thoroughly with soap and water for at least 20 seconds. You can use an alcohol-based hand sanitizer if soap and water are not available and if your hands are not visibly dirty. Avoid touching your eyes, nose, and mouth with unwashed hands.   Prevention Steps for Caregivers and Household Members of Individuals Confirmed to have, or Being Evaluated for, COVID-19 Infection Being Cared for in the Home  If you live with, or provide care at home for, a person confirmed to have, or being evaluated for, COVID-19 infection please follow these guidelines to prevent infection:  Follow healthcare provider's instructions Make sure that you understand and can help the patient follow any healthcare provider instructions for all care.  Provide for the patient's basic needs You should help the patient with basic needs in the home and provide support for getting groceries, prescriptions, and other personal needs.  Monitor the patient's symptoms If they are getting sicker, call his or her medical provider and tell them that the patient has, or is being evaluated for, COVID-19 infection. This will help the healthcare provider's  office take steps to keep other people from getting infected. Ask the healthcare provider to call the local or state health department.  Limit the number of people who have contact with the patient If possible, have only one caregiver for the  patient. Other household members should stay in another home or place of residence. If this is not possible, they should stay in another room, or be separated from the patient as much as possible. Use a separate bathroom, if available. Restrict visitors who do not have an essential need to be in the home.  Keep older adults, very young children, and other sick people away from the patient Keep older adults, very young children, and those who have compromised immune systems or chronic health conditions away from the patient. This includes people with chronic heart, lung, or kidney conditions, diabetes, and cancer.  Ensure good ventilation Make sure that shared spaces in the home have good air flow, such as from an air conditioner or an opened window, weather permitting.  Wash your hands often Wash your hands often and thoroughly with soap and water for at least 20 seconds. You can use an alcohol based hand sanitizer if soap and water are not available and if your hands are not visibly dirty. Avoid touching your eyes, nose, and mouth with unwashed hands. Use disposable paper towels to dry your hands. If not available, use dedicated cloth towels and replace them when they become wet.  Wear a facemask and gloves Wear a disposable facemask at all times in the room and gloves when you touch or have contact with the patient's blood, body fluids, and/or secretions or excretions, such as sweat, saliva, sputum, nasal mucus, vomit, urine, or feces.  Ensure the mask fits over your nose and mouth tightly, and do not touch it during use. Throw out disposable facemasks and gloves after using them. Do not reuse. Wash your hands immediately after removing your facemask and gloves. If your personal clothing becomes contaminated, carefully remove clothing and launder. Wash your hands after handling contaminated clothing. Place all used disposable facemasks, gloves, and other waste in a lined container before  disposing them with other household waste. Remove gloves and wash your hands immediately after handling these items.  Do not share dishes, glasses, or other household items with the patient Avoid sharing household items. You should not share dishes, drinking glasses, cups, eating utensils, towels, bedding, or other items with a patient who is confirmed to have, or being evaluated for, COVID-19 infection. After the person uses these items, you should wash them thoroughly with soap and water.  Wash laundry thoroughly Immediately remove and wash clothes or bedding that have blood, body fluids, and/or secretions or excretions, such as sweat, saliva, sputum, nasal mucus, vomit, urine, or feces, on them. Wear gloves when handling laundry from the patient. Read and follow directions on labels of laundry or clothing items and detergent. In general, wash and dry with the warmest temperatures recommended on the label.  Clean all areas the individual has used often Clean all touchable surfaces, such as counters, tabletops, doorknobs, bathroom fixtures, toilets, phones, keyboards, tablets, and bedside tables, every day. Also, clean any surfaces that may have blood, body fluids, and/or secretions or excretions on them. Wear gloves when cleaning surfaces the patient has come in contact with. Use a diluted bleach solution (e.g., dilute bleach with 1 part bleach and 10 parts water) or a household disinfectant with a label that says EPA-registered for coronaviruses. To make a  bleach solution at home, add 1 tablespoon of bleach to 1 quart (4 cups) of water. For a larger supply, add  cup of bleach to 1 gallon (16 cups) of water. Read labels of cleaning products and follow recommendations provided on product labels. Labels contain instructions for safe and effective use of the cleaning product including precautions you should take when applying the product, such as wearing gloves or eye protection and making sure you  have good ventilation during use of the product. Remove gloves and wash hands immediately after cleaning.  Monitor yourself for signs and symptoms of illness Caregivers and household members are considered close contacts, should monitor their health, and will be asked to limit movement outside of the home to the extent possible. Follow the monitoring steps for close contacts listed on the symptom monitoring form.   ? If you have additional questions, contact your local health department or call the epidemiologist on call at [phone] (available 24/7). ? This guidance is subject to change. For the most up-to-date guidance from [facility], please refer to their website: TripMetro.hu   Buy a pulse oximeter from [facility] [facility] or [employer], return for saturation less than 90%.  You may take vitamin C, and D as well as zyrtec daily.  You may also take melatonin 3 mg for sleep.

## 2020-04-09 NOTE — ED Triage Notes (Signed)
Patient comes from [facility]. Patient is complaining of sob, muscle ache, low grade fever, and fatigue. Patient states she has had tubal ligation. Patient also has been taking some doxycycline from the last time she was here.

## 2020-04-09 NOTE — ED Provider Notes (Signed)
[facility]-EMERGENCY DEPT Provider Note   CSN: [id number] Arrival date & time: 04/09/20  0133     History Chief Complaint  Patient presents with  . Fatigue    Kathy Willis is a 39 y.o. female.  The history is provided by the patient.  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  She is unvaccinated.  Now has loss of taste and smell and subjective breathing issues.      Past Medical History:  Diagnosis Date  . Acute pyelonephritis 10/10/2018    Patient Active Problem List   Diagnosis Date Noted  . Depression 10/11/2018  . Alcohol abuse 10/11/2018  . Hypokalemia 10/10/2018  . Dehydration 10/10/2018  . Thrush 10/10/2018  . Acute pyelonephritis 10/10/2018  . Bacterial vaginosis 10/10/2018  . Sepsis (HCC) 10/10/2018  . Anemia 10/10/2018    Past Surgical History:  Procedure Laterality Date  . TENDON REPAIR Right 11/05/2015   Procedure: TENDON REPAIR RIGHT SMALL FINGER;  Surgeon: Dominica Severin, MD;  Location: [facility] OR;  Service: Orthopedics;  Laterality: Right;     OB History    Gravida  9   Para  4   Term  4   Preterm      AB  4   Living  4     SAB      TAB  4   Ectopic      Multiple      Live Births              Family History  Problem Relation Age of Onset  . Healthy Mother   . Healthy Father   . Diabetes Neg Hx   . Hypertension Neg Hx   . Cancer Neg Hx     Social History   Tobacco Use  . Smoking status: Current Every Day Smoker    Packs/day: 0.50  . Smokeless tobacco:  Never Used  Vaping Use  . Vaping Use: Never used  Substance Use Topics  . Alcohol use: Yes    Comment: occasion  . Drug use: Yes    Types: Marijuana    Comment: daily use    Home Medications Prior to Admission medications   Medication Sig Start Date End Date Taking? Authorizing Provider  dicyclomine (BENTYL) 20 MG tablet Take 1 tablet (20 mg total) by mouth every 6 (six) hours as needed for spasms. 04/09/19   Raeford Razor, MD  doxycycline (VIBRAMYCIN) 100 MG capsule Take 1 capsule (100 mg total) by mouth 2 (two) times daily. 03/29/20   Terrilee Files, MD  ibuprofen (ADVIL) 600 MG tablet Take 1 tablet (600 mg total) by mouth every 8 (eight) hours as needed for moderate pain. 03/29/20   Terrilee Files, MD  loperamide (IMODIUM A-D) 2 MG tablet Take 2 mg by mouth 4 (four) times daily as needed for diarrhea or loose stools.    [provider]  mirtazapine (REMERON) 7.5 MG tablet Take 1 tablet (7.5 mg total) by mouth at bedtime. Patient not taking: Reported on 04/09/2019 10/13/18   Alanda Slim,  Boyce Medici, MD  potassium chloride SA (K-DUR) 20 MEQ tablet Take 1 tablet (20 mEq total) by mouth 2 (two) times daily. 04/06/19   Dione Booze, MD  ferrous sulfate 325 (65 FE) MG EC tablet Take 1 tablet (325 mg total) by mouth 2 (two) times daily. Patient not taking: Reported on 04/05/2019 10/13/18 04/06/19  Almon Hercules, MD    Allergies    Shellfish allergy  Review of Systems   Review of Systems  Constitutional: Positive for fatigue. Negative for appetite change and fever.  HENT: Positive for congestion. Negative for hearing loss.   Eyes: Negative for visual disturbance.  Respiratory: Negative for cough.   Cardiovascular: Negative for chest pain.  Gastrointestinal: Negative for abdominal pain, diarrhea and vomiting.  Genitourinary: Negative for dysuria.  Musculoskeletal: Positive for myalgias. Negative for neck pain and neck stiffness.  Neurological: Negative for dizziness.    Psychiatric/Behavioral: Negative for agitation.  All other systems reviewed and are negative.   Physical Exam Updated Vital Signs BP 119/63 (BP Location: Right Arm)   Pulse (!) 56   Temp 98.9 F (37.2 C) (Oral)   Resp (!) 23   Ht 5\' 3"  (1.6 m)   Wt 46.7 kg   SpO2 100%   BMI 18.25 kg/m   Physical Exam Vitals and nursing note reviewed.  Constitutional:      General: She is not in acute distress.    Appearance: Normal appearance.  HENT:     Head: Normocephalic and atraumatic.     Nose: Nose normal.  Eyes:     Conjunctiva/sclera: Conjunctivae normal.     Pupils: Pupils are equal, round, and reactive to light.  Cardiovascular:     Rate and Rhythm: Normal rate and regular rhythm.     Pulses: Normal pulses.     Heart sounds: Normal heart sounds.  Pulmonary:     Effort: Pulmonary effort is normal.     Breath sounds: Normal breath sounds.  Abdominal:     General: Abdomen is flat. Bowel sounds are normal.     Palpations: Abdomen is soft.     Tenderness: There is no abdominal tenderness. There is no guarding.  Musculoskeletal:        General: Normal range of motion.     Cervical back: Normal range of motion and neck supple.  Skin:    General: Skin is warm and dry.     Capillary Refill: Capillary refill takes less than 2 seconds.  Neurological:     General: No focal deficit present.     Mental Status: She is alert and oriented to person, place, and time.     Deep Tendon Reflexes: Reflexes normal.  Psychiatric:        Mood and Affect: Mood normal.        Behavior: Behavior normal.     ED Results / Procedures / Treatments   Labs (all labs ordered are listed, but only abnormal results are displayed) Results for orders placed or performed during the hospital encounter of 04/09/20  Basic metabolic panel  Result Value Ref Range   Sodium 141 135 - 145 mmol/L   Potassium 3.1 (L) 3.5 - 5.1 mmol/L   Chloride 104 98 - 111 mmol/L   CO2 26 22 - 32 mmol/L   Glucose, Bld 86 70  - 99 mg/dL   BUN 9 6 - 20 mg/dL   Creatinine, Ser 04/11/20 0.44 - 1.00 mg/dL   Calcium 9.4 8.9 - 0.27 mg/dL   GFR calc  non Af Amer >60 >60 mL/min   GFR calc Af Amer >60 >60 mL/min   Anion gap 11 5 - 15  CBC  Result Value Ref Range   WBC 5.4 4.0 - 10.5 K/uL   RBC 4.06 3.87 - 5.11 MIL/uL   Hemoglobin 9.8 (L) 12.0 - 15.0 g/dL   HCT 38.7 (L) 36 - 46 %   MCV 78.6 (L) 80.0 - 100.0 fL   MCH 24.1 (L) 26.0 - 34.0 pg   MCHC 30.7 30.0 - 36.0 g/dL   RDW 56.4 (H) 33.2 - 95.1 %   Platelets 388 150 - 400 K/uL   nRBC 0.0 0.0 - 0.2 %  hCG, quantitative, pregnancy  Result Value Ref Range   hCG, Beta Chain, Quant, S <1 <5 mIU/mL  CBG monitoring, ED  Result Value Ref Range   Glucose-Capillary 91 70 - 99 mg/dL   DG Chest Port 1 View  Result Date: 03/29/2020 CLINICAL DATA:  Cough and shortness of breath. EXAM: PORTABLE CHEST 1 VIEW COMPARISON:  July 09, 2017 FINDINGS: There is no evidence of acute infiltrate, pleural effusion or pneumothorax. Bilateral radiopaque nipple piercings are seen. The heart size and mediastinal contours are within normal limits. The visualized skeletal structures are unremarkable. IMPRESSION: No active disease. Electronically Signed   By: Aram Candela M.D.   On: 03/29/2020 23:03    Radiology NACPD on CXR  Procedures Procedures (including critical care time)  Medications Ordered in ED Medications - No data to display  ED Course  I have reviewed the triage vital signs and the nursing notes.  Pertinent labs & imaging results that were available during my care of the patient were reviewed by me and considered in my medical decision making (see chart for details).    Symptoms are consistent with covid 19.  She has been exposed and is unvaccinated. I will write a note for 10 days from today.  I have instructed vitamin C, D, zyrtec and melatonin along with buying a pulse oximeter and checking her oxygen saturation.    Kathy Willis was evaluated in Emergency  Department on 04/09/2020 for the symptoms described in the history of present illness. She was evaluated in the context of the global COVID-19 pandemic, which necessitated consideration that the patient might be at risk for infection with the SARS-CoV-2 virus that causes COVID-19. Institutional protocols and algorithms that pertain to the evaluation of patients at risk for COVID-19 are in a state of rapid change based on information released by regulatory bodies including the CDC and federal and state organizations. These policies and algorithms were followed during the patient's care in the ED.  Final Clinical Impression(s) / ED Diagnoses Return for intractable cough, coughing up blood,fevers >100.4 unrelieved by medication, shortness of breath, intractable vomiting, chest pain, shortness of breath, weakness,numbness, changes in speech, facial asymmetry,abdominal pain, passing out,Inability to tolerate liquids or food, cough, altered mental status or any concerns. No signs of systemic illness or infection. The patient is nontoxic-appearing on exam and vital signs are within normal limits.   I have reviewed the triage vital signs and the nursing notes. Pertinent labs &imaging results that were available during my care of the patient were reviewed by me and considered in my medical decision making (see chart for details).After history, exam, and medical workup I feel the patient has beenappropriately medically screened and is safe for discharge home. Pertinent diagnoses were discussed with the patient. Patient was given return precautions.   Nicanor Alcon Obelia Bonello, MD  04/09/20 0647  

## 2020-04-10 ENCOUNTER — Encounter: Payer: Self-pay | Admitting: Adult Health

## 2020-04-10 ENCOUNTER — Other Ambulatory Visit: Payer: Self-pay | Admitting: Adult Health

## 2020-04-10 DIAGNOSIS — U071 COVID-19: Secondary | ICD-10-CM

## 2020-04-10 NOTE — Progress Notes (Signed)
I connected by phone with Kathy Willis on 04/10/2020 at 9:57 AM to discuss the potential use of a new treatment for mild to moderate COVID-19 viral infection in non-hospitalized patients.  This patient is a 39 y.o. female that meets the FDA criteria for Emergency Use Authorization of COVID monoclonal antibody casirivimab/imdevimab.  Has a (+) direct SARS-CoV-2 viral test result  Has mild or moderate COVID-19   Is NOT hospitalized due to COVID-19  Is within 10 days of symptom onset  Has at least one of the high risk factor(s) for progression to severe COVID-19 and/or hospitalization as defined in EUA.  Specific high risk criteria : Chronic Lung Disease and Other high risk medical condition per CDC:  disparate group   I have spoken and communicated the following to the patient or parent/caregiver regarding COVID monoclonal antibody treatment:  1. FDA has authorized the emergency use for the treatment of mild to moderate COVID-19 in adults and pediatric patients with positive results of direct SARS-CoV-2 viral testing who are 24 years of age and older weighing at least 40 kg, and who are at high risk for progressing to severe COVID-19 and/or hospitalization.  2. The significant known and potential risks and benefits of COVID monoclonal antibody, and the extent to which such potential risks and benefits are unknown.  3. Information on available alternative treatments and the risks and benefits of those alternatives, including clinical trials.  4. Patients treated with COVID monoclonal antibody should continue to self-isolate and use infection control measures (e.g., wear mask, isolate, social distance, avoid sharing personal items, clean and disinfect "high touch" surfaces, and frequent handwashing) according to CDC guidelines.   5. The patient or parent/caregiver has the option to accept or refuse COVID monoclonal antibody treatment.  After reviewing this information with the patient, The  patient agreed to proceed with receiving casirivimab\imdevimab infusion and will be provided a copy of the Fact sheet prior to receiving the infusion. [name] 04/10/2020 9:57 AM

## 2020-04-11 ENCOUNTER — Ambulatory Visit (HOSPITAL_COMMUNITY): Payer: Medicaid Other

## 2020-04-24 ENCOUNTER — Encounter (HOSPITAL_COMMUNITY): Payer: Self-pay | Admitting: Emergency Medicine

## 2020-04-24 ENCOUNTER — Other Ambulatory Visit: Payer: Self-pay

## 2020-04-24 ENCOUNTER — Emergency Department (HOSPITAL_COMMUNITY): Payer: Medicaid Other

## 2020-04-24 ENCOUNTER — Emergency Department (HOSPITAL_COMMUNITY)
Admission: EM | Admit: 2020-04-24 | Discharge: 2020-04-24 | Disposition: A | Discharge status: Home / Self Care | Payer: Medicaid Other | Attending: Emergency Medicine | Admitting: Emergency Medicine

## 2020-04-24 DIAGNOSIS — D509 Iron deficiency anemia, unspecified: Secondary | ICD-10-CM | POA: Diagnosis not present

## 2020-04-24 DIAGNOSIS — R918 Other nonspecific abnormal finding of lung field: Secondary | ICD-10-CM | POA: Insufficient documentation

## 2020-04-24 DIAGNOSIS — E876 Hypokalemia: Secondary | ICD-10-CM

## 2020-04-24 DIAGNOSIS — Z79899 Other long term (current) drug therapy: Secondary | ICD-10-CM | POA: Insufficient documentation

## 2020-04-24 DIAGNOSIS — F17200 Nicotine dependence, unspecified, uncomplicated: Secondary | ICD-10-CM | POA: Insufficient documentation

## 2020-04-24 DIAGNOSIS — S0181XA Laceration without foreign body of other part of head, initial encounter: Secondary | ICD-10-CM

## 2020-04-24 DIAGNOSIS — Y929 Unspecified place or not applicable: Secondary | ICD-10-CM | POA: Diagnosis not present

## 2020-04-24 DIAGNOSIS — F10129 Alcohol abuse with intoxication, unspecified: Secondary | ICD-10-CM | POA: Diagnosis not present

## 2020-04-24 DIAGNOSIS — Y999 Unspecified external cause status: Secondary | ICD-10-CM | POA: Diagnosis not present

## 2020-04-24 DIAGNOSIS — F10920 Alcohol use, unspecified with intoxication, uncomplicated: Secondary | ICD-10-CM

## 2020-04-24 DIAGNOSIS — Y9389 Activity, other specified: Secondary | ICD-10-CM | POA: Insufficient documentation

## 2020-04-24 DIAGNOSIS — W19XXXA Unspecified fall, initial encounter: Secondary | ICD-10-CM

## 2020-04-24 DIAGNOSIS — R935 Abnormal findings on diagnostic imaging of other abdominal regions, including retroperitoneum: Secondary | ICD-10-CM | POA: Diagnosis not present

## 2020-04-24 LAB — COMPREHENSIVE METABOLIC PANEL
ALT: 10 U/L (ref 0–44)
AST: 19 U/L (ref 15–41)
Albumin: 4.2 g/dL (ref 3.5–5.0)
Alkaline Phosphatase: 63 U/L (ref 38–126)
Anion gap: 12 (ref 5–15)
BUN: 8 mg/dL (ref 6–20)
CO2: 23 mmol/L (ref 22–32)
Calcium: 8.6 mg/dL — ABNORMAL LOW (ref 8.9–10.3)
Chloride: 108 mmol/L (ref 98–111)
Creatinine, Ser: 0.66 mg/dL (ref 0.44–1.00)
GFR calc Af Amer: >60 mL/min (ref >60)
GFR calc non Af Amer: >60 mL/min (ref >60)
Glucose, Bld: 93 mg/dL (ref 70–99)
Potassium: 3.1 mmol/L — ABNORMAL LOW (ref 3.5–5.1)
Sodium: 143 mmol/L (ref 135–145)
Total Bilirubin: 0.5 mg/dL (ref 0.3–1.2)
Total Protein: 7.3 g/dL (ref 6.5–8.1)

## 2020-04-24 LAB — CBC WITH DIFFERENTIAL/PLATELET
Abs Immature Granulocytes: 0.02 10*3/uL (ref 0.00–0.07)
Basophils Absolute: 0 10*3/uL (ref 0.0–0.1)
Basophils Relative: 1 %
Eosinophils Absolute: 0.2 10*3/uL (ref 0.0–0.5)
Eosinophils Relative: 3 %
HCT: 29.3 % — ABNORMAL LOW (ref 36.0–46.0)
Hemoglobin: 8.9 g/dL — ABNORMAL LOW (ref 12.0–15.0)
Immature Granulocytes: 0 %
Lymphocytes Relative: 34 %
Lymphs Abs: 2.2 10*3/uL (ref 0.7–4.0)
MCH: 23.8 pg — ABNORMAL LOW (ref 26.0–34.0)
MCHC: 30.4 g/dL (ref 30.0–36.0)
MCV: 78.3 fL — ABNORMAL LOW (ref 80.0–100.0)
Monocytes Absolute: 0.3 10*3/uL (ref 0.1–1.0)
Monocytes Relative: 5 %
Neutro Abs: 3.6 10*3/uL (ref 1.7–7.7)
Neutrophils Relative %: 57 %
Platelets: 630 10*3/uL — ABNORMAL HIGH (ref 150–400)
RBC: 3.74 MIL/uL — ABNORMAL LOW (ref 3.87–5.11)
RDW: 17.9 % — ABNORMAL HIGH (ref 11.5–15.5)
WBC: 6.4 10*3/uL (ref 4.0–10.5)
nRBC: 0 % (ref 0.0–0.2)

## 2020-04-24 LAB — I-STAT BETA HCG BLOOD, ED (MC, WL, AP ONLY): I-stat hCG, quantitative: <5 m[IU]/mL (ref <5)

## 2020-04-24 LAB — ETHANOL: Alcohol, Ethyl (B): 246 mg/dL — ABNORMAL HIGH (ref <10)

## 2020-04-24 MED ORDER — LIDOCAINE-EPINEPHRINE-TETRACAINE (LET) TOPICAL GEL
3.0000 mL | Freq: Once | TOPICAL | Status: AC
  Administered 2020-04-24: 3 mL via TOPICAL
  Filled 2020-04-24: qty 3

## 2020-04-24 MED ORDER — POTASSIUM CHLORIDE CRYS ER 20 MEQ PO TBCR: 20.0000 meq | EXTENDED_RELEASE_TABLET | Freq: Two times a day (BID) | ORAL | 0 refills | Status: AC

## 2020-04-24 MED ORDER — IOHEXOL 300 MG/ML  SOLN
100.0000 mL | Freq: Once | INTRAMUSCULAR | Status: AC | PRN
  Administered 2020-04-24: 100 mL via INTRAVENOUS

## 2020-04-24 MED ORDER — LIDOCAINE-EPINEPHRINE (PF) 2 %-1:200000 IJ SOLN
10.0000 mL | Freq: Once | INTRAMUSCULAR | Status: AC
  Administered 2020-04-24: 10 mL
  Filled 2020-04-24: qty 20

## 2020-04-24 NOTE — ED Provider Notes (Signed)
Care of the patient assumed at the change of shift pending evaluation of injuries and sobering. She has had her laceration repaired. She is awake and alert now. No signs of significant injury on CT images. Anemia is worse than baseline but not in need of transfusion. Patient has been ambulatory in the ED with a steady gait. She is not suicidal, not a danger to self or others. Ready for discharge.    Pollyann Savoy, MD 04/24/20 [phone]

## 2020-04-24 NOTE — ED Provider Notes (Signed)
..  Laceration Repair  Date/Time: 04/24/2020 2:31 PM Performed by: Arthor Captain, PA-C Authorized by: Arthor Captain, PA-C   Consent:    Consent obtained:  Verbal   Consent given by:  Patient   Risks discussed:  Infection, need for additional repair, pain, poor cosmetic result and poor wound healing   Alternatives discussed:  No treatment and delayed treatment Universal protocol:    Procedure explained and questions answered to patient or proxy's satisfaction: yes     Relevant documents present and verified: yes     Test results available and properly labeled: yes     Imaging studies available: yes     Required blood products, implants, devices, and special equipment available: yes     Site/side marked: yes     Immediately prior to procedure, a time out was called: yes     Patient identity confirmed:  Verbally with patient Anesthesia (see MAR for exact dosages):    Anesthesia method:  Local infiltration   Local anesthetic:  Lidocaine 1% WITH epi Laceration details:    Location:  Face   Face location:  Forehead   Length (cm):  3   Depth (mm):  3 Repair type:    Repair type:  Simple Pre-procedure details:    Preparation:  Patient was prepped and draped in usual sterile fashion Exploration:    Wound exploration: wound explored through full range of motion and entire depth of wound probed and visualized     Contaminated: no   Treatment:    Area cleansed with:  Betadine   Amount of cleaning:  Standard   Irrigation solution:  Sterile saline Skin repair:    Repair method:  Sutures   Suture size:  5-0   Suture material:  Plain gut   Suture technique:  Simple interrupted   Number of sutures:  3 Approximation:    Approximation:  Close Post-procedure details:    Patient tolerance of procedure:  Tolerated well, no immediate complications      Arthor Captain, PA-C 04/24/20 1433    Pollyann Savoy, MD 04/24/20 1954

## 2020-04-24 NOTE — ED Triage Notes (Signed)
Patient presents s/p fall. Patient called EMS complaining of falling down the stairs after drinking and possible drug use. On arrival, patient began making suicidal and homicidal statement and stated to EMS, "If you take me, I'm going to fight." Patient became combative and was restrained and given Haldol 5 mg IM. Patient arrives agitated still, but somnolent.

## 2020-04-24 NOTE — ED Provider Notes (Signed)
[facility]-EMERGENCY DEPT Provider Note   CSN: [id number] Arrival date & time: 04/24/20  0601   History Chief Complaint  Patient presents with   Medical Clearance   Suicidal    Kathy Willis is a 39 y.o. female.  The history is provided by the EMS personnel. The history is limited by the condition of the patient (Altered mental status).  39 year old female was brought in by EMS following a fall.  She apparently called EMS because of a fall, told him that she had been drinking heavily and using some drugs.  She did suffer a laceration to her forehead.  She then became very combative and had to be sedated in the ambulance.  She was given haloperidol, and is now resting comfortably.  However, she is unable to answer any questions.  Past Medical History:  Diagnosis Date   Acute pyelonephritis 10/10/2018    Patient Active Problem List   Diagnosis Date Noted   Depression 10/11/2018   Alcohol abuse 10/11/2018   Hypokalemia 10/10/2018   Dehydration 10/10/2018   Thrush 10/10/2018   Acute pyelonephritis 10/10/2018   Bacterial vaginosis 10/10/2018   Sepsis (HCC) 10/10/2018   Anemia 10/10/2018    Past Surgical History:  Procedure Laterality Date   TENDON REPAIR Right 11/05/2015   Procedure: TENDON REPAIR RIGHT SMALL FINGER;  Surgeon: Dominica Severin, MD;  Location: [facility] OR;  Service: Orthopedics;  Laterality: Right;     OB History    Gravida  9   Para  4   Term  4   Preterm      AB  4   Living  4     SAB      TAB  4   Ectopic      Multiple      Live Births              Family History  Problem Relation Age of Onset   Healthy Mother    Healthy Father    Diabetes Neg Hx    Hypertension Neg Hx    Cancer Neg Hx     Social History   Tobacco Use   Smoking status: Current Every Day Smoker    Packs/day: 0.50   Smokeless tobacco: Never Used  Vaping Use   Vaping Use: Never used  Substance Use Topics   Alcohol  use: Yes    Comment: occasion   Drug use: Yes    Types: Marijuana    Comment: daily use    Home Medications Prior to Admission medications   Medication Sig Start Date End Date Taking? Authorizing Provider  dicyclomine (BENTYL) 20 MG tablet Take 1 tablet (20 mg total) by mouth every 6 (six) hours as needed for spasms. 04/09/19   Raeford Razor, MD  doxycycline (VIBRAMYCIN) 100 MG capsule Take 1 capsule (100 mg total) by mouth 2 (two) times daily. 03/29/20   Terrilee Files, MD  ibuprofen (ADVIL) 600 MG tablet Take 1 tablet (600 mg total) by mouth every 8 (eight) hours as needed for moderate pain. 03/29/20   Terrilee Files, MD  loperamide (IMODIUM A-D) 2 MG tablet Take 2 mg by mouth 4 (four) times daily as needed for diarrhea or loose stools.    [provider]  mirtazapine (REMERON) 7.5 MG tablet Take 1 tablet (7.5 mg total) by mouth at bedtime. Patient not taking: Reported on 04/09/2019 10/13/18   Almon Hercules, MD  potassium chloride SA (K-DUR) 20 MEQ tablet Take 1 tablet (  20 mEq total) by mouth 2 (two) times daily. 04/06/19   Dione Booze, MD  ferrous sulfate 325 (65 FE) MG EC tablet Take 1 tablet (325 mg total) by mouth 2 (two) times daily. Patient not taking: Reported on 04/05/2019 10/13/18 04/06/19  Almon Hercules, MD    Allergies    Shellfish allergy  Review of Systems   Review of Systems  Unable to perform ROS: Mental status change    Physical Exam Updated Vital Signs BP (!) 115/96 (BP Location: Right Arm)    Pulse 68    Temp 98 F (36.7 C) (Oral)    Resp 18    SpO2 100%   Physical Exam Vitals and nursing note reviewed.   39 year old female, resting comfortably and in no acute distress. Vital signs are normal. Oxygen saturation is 100%, which is normal. Head is normocephalic.  Laceration is present on the left side of the forehead. PERRLA, EOMI. Oropharynx is clear. Neck is nontender without adenopathy or JVD. Back is nontender and there is no CVA tenderness. Lungs  are clear without rales, wheezes, or rhonchi. Chest is nontender. Heart has regular rate and rhythm without murmur. Abdomen is soft, flat, nontender without masses or hepatosplenomegaly and peristalsis is normoactive. Extremities have no cyanosis or edema, full range of motion is present. Skin is warm and dry without rash. Neurologic: Somnolent but does react purposefully to painful stimuli, cranial nerves are grossly intact.  She moves all extremities equally.   ED Results / Procedures / Treatments   Labs (all labs ordered are listed, but only abnormal results are displayed) Labs Reviewed  COMPREHENSIVE METABOLIC PANEL - Abnormal; Notable for the following components:      Result Value   Potassium 3.1 (*)    Calcium 8.6 (*)    All other components within normal limits  ETHANOL - Abnormal; Notable for the following components:   Alcohol, Ethyl (B) 246 (*)    All other components within normal limits  CBC WITH DIFFERENTIAL/PLATELET - Abnormal; Notable for the following components:   RBC 3.74 (*)    Hemoglobin 8.9 (*)    HCT 29.3 (*)    MCV 78.3 (*)    MCH 23.8 (*)    RDW 17.9 (*)    Platelets 630 (*)    All other components within normal limits  RAPID URINE DRUG SCREEN, HOSP PERFORMED  I-STAT BETA HCG BLOOD, ED ([facility], [facility], [facility] ONLY)   Radiology No results found.  Procedures Procedures  CRITICAL CARE Performed by: Dione Booze Total critical care time: 45 minutes Critical care time was exclusive of separately billable procedures and treating other patients. Critical care was necessary to treat or prevent imminent or life-threatening deterioration. Critical care was time spent personally by me on the following activities: development of treatment plan with patient and/or surrogate as well as nursing, discussions with consultants, evaluation of patient's response to treatment, examination of patient, obtaining history from patient or surrogate, ordering and performing treatments and  interventions, ordering and review of laboratory studies, ordering and review of radiographic studies, pulse oximetry and re-evaluation of patient's condition.   Medications Ordered in ED Medications  lidocaine-EPINEPHrine (XYLOCAINE W/EPI) 2 %-1:200000 (PF) injection 10 mL (has no administration in time range)    ED Course  I have reviewed the triage vital signs and the nursing notes.  Pertinent labs & imaging results that were available during my care of the patient were reviewed by me and considered in my medical decision making (  see chart for details).  MDM Rules/Calculators/A&P Follow-up with forehead laceration.  Altered mentation now is probably mostly from haloperidol injection, but cannot be sure there is not an underlying closed head injury so will be sent for CT of head.  Old records are reviewed, and she had received Tdap in 2017, so tetanus does not need to be updated today.  Will check screening labs including ethanol level.  Will need to be reassessed once haloperidol has worn off.  CT scans do not show any acute injury per my reading, radiologist interpretation pending.  Labs do show anemia with slight drop compared with 2 weeks ago home.  Also, hypokalemia which is unchanged from 2 weeks ago.  Ethanol level is significantly elevated consistent with clinical intoxication.  Laceration will be repaired by Arthor Captain, PA-C.  Because of drop in hemoglobin, will send for CT of chest, abdomen, pelvis to look for evidence of internal bleeding.  Case is signed out to Dr. Bernette Mayers.  Final Clinical Impression(s) / ED Diagnoses Final diagnoses:  Fall, initial encounter  Forehead laceration, initial encounter  Alcohol intoxication, uncomplicated (HCC)  Hypokalemia  Microcytic anemia    Rx / DC Orders ED Discharge Orders    None       Dione Booze, MD 04/24/20 [id number]

## 2020-10-28 ENCOUNTER — Other Ambulatory Visit: Payer: Self-pay

## 2020-10-28 ENCOUNTER — Emergency Department (HOSPITAL_COMMUNITY)
Admission: EM | Admit: 2020-10-28 | Discharge: 2020-10-28 | Disposition: A | Discharge status: Home / Self Care | Payer: Medicaid Other | Attending: Emergency Medicine | Admitting: Emergency Medicine

## 2020-10-28 ENCOUNTER — Emergency Department (HOSPITAL_COMMUNITY): Payer: Medicaid Other

## 2020-10-28 ENCOUNTER — Encounter (HOSPITAL_COMMUNITY): Payer: Self-pay | Admitting: Emergency Medicine

## 2020-10-28 DIAGNOSIS — Y92410 Unspecified street and highway as the place of occurrence of the external cause: Secondary | ICD-10-CM | POA: Diagnosis not present

## 2020-10-28 DIAGNOSIS — M542 Cervicalgia: Secondary | ICD-10-CM | POA: Insufficient documentation

## 2020-10-28 DIAGNOSIS — F17200 Nicotine dependence, unspecified, uncomplicated: Secondary | ICD-10-CM | POA: Diagnosis not present

## 2020-10-28 DIAGNOSIS — M79672 Pain in left foot: Secondary | ICD-10-CM | POA: Diagnosis not present

## 2020-10-28 MED ORDER — CYCLOBENZAPRINE HCL 10 MG PO TABS: 10.0000 mg | ORAL_TABLET | Freq: Two times a day (BID) | ORAL | 0 refills | Status: AC | PRN
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
## 2020-10-28 NOTE — Discharge Instructions (Addendum)
You have been seen here for left foot pain, I have started you on a muscle relaxer please take as prescribed for pain, please beware this medication can make you drowsy do not consume alcohol or operate heavy machinery while taking this medication.  I recommend taking over-the-counter pain medications like ibuprofen and/or Tylenol every 6 hours as needed.  Please follow dosage and on the back of bottle.  I also recommend applying heat to the area and stretching out the muscles as this will help decrease stiffness and pain.    I suspect it will take 1 to 2 weeks for you to fully recover, if this does not happen please follow-up with your primary care doctor for further evaluation.  Come back to the emergency department if you develop chest pain, shortness of breath, severe abdominal pain, uncontrolled nausea, vomiting, diarrhea.

## 2021-04-19 ENCOUNTER — Ambulatory Visit: Payer: Medicaid Other | Admitting: Obstetrics
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
## 2024-02-27 ENCOUNTER — Ambulatory Visit
